# Patient Record
Sex: Female | Born: 1950 | Race: White | Hispanic: No | Smoking: Never smoker
Health system: Southern US, Community
[De-identification: ages and names within clinical notes are randomized; demographics above are authoritative.]

## PROBLEM LIST (undated history)

## (undated) DIAGNOSIS — M1990 Unspecified osteoarthritis, unspecified site: Secondary | ICD-10-CM

## (undated) DIAGNOSIS — R112 Nausea with vomiting, unspecified: Secondary | ICD-10-CM

## (undated) DIAGNOSIS — I10 Essential (primary) hypertension: Secondary | ICD-10-CM

## (undated) DIAGNOSIS — N189 Chronic kidney disease, unspecified: Secondary | ICD-10-CM

## (undated) DIAGNOSIS — F32A Depression, unspecified: Secondary | ICD-10-CM

## (undated) DIAGNOSIS — E7800 Pure hypercholesterolemia, unspecified: Secondary | ICD-10-CM

## (undated) DIAGNOSIS — F419 Anxiety disorder, unspecified: Secondary | ICD-10-CM

## (undated) DIAGNOSIS — Z9889 Other specified postprocedural states: Secondary | ICD-10-CM

## (undated) DIAGNOSIS — F329 Major depressive disorder, single episode, unspecified: Secondary | ICD-10-CM

## (undated) DIAGNOSIS — R011 Cardiac murmur, unspecified: Secondary | ICD-10-CM

## (undated) DIAGNOSIS — T7840XA Allergy, unspecified, initial encounter: Secondary | ICD-10-CM

## (undated) HISTORY — DX: Allergy, unspecified, initial encounter: T78.40XA

## (undated) HISTORY — PX: FOOT SURGERY: SHX648

## (undated) HISTORY — DX: Anxiety disorder, unspecified: F41.9

## (undated) HISTORY — DX: Pure hypercholesterolemia, unspecified: E78.00

## (undated) HISTORY — PX: ABDOMINAL HYSTERECTOMY: SHX81

## (undated) HISTORY — PX: TONSILLECTOMY: SUR1361

## (undated) HISTORY — DX: Essential (primary) hypertension: I10

## (undated) HISTORY — PX: SHOULDER SURGERY: SHX246

## (undated) HISTORY — PX: TENDON REPAIR: SHX5111

---

## 1998-02-28 ENCOUNTER — Other Ambulatory Visit: Admission: RE | Admit: 1998-02-28 | Discharge: 1998-02-28 | Payer: Self-pay | Admitting: Gynecology

## 1998-09-29 ENCOUNTER — Other Ambulatory Visit: Admission: RE | Admit: 1998-09-29 | Discharge: 1998-09-29 | Payer: Self-pay | Admitting: Gynecology

## 1999-05-02 ENCOUNTER — Other Ambulatory Visit: Admission: RE | Admit: 1999-05-02 | Discharge: 1999-05-02 | Payer: Self-pay | Admitting: Gynecology

## 1999-07-26 ENCOUNTER — Encounter (INDEPENDENT_AMBULATORY_CARE_PROVIDER_SITE_OTHER): Payer: Self-pay | Admitting: Specialist

## 1999-07-26 ENCOUNTER — Inpatient Hospital Stay (HOSPITAL_COMMUNITY): Admission: RE | Admit: 1999-07-26 | Discharge: 1999-07-27 | Payer: Self-pay | Admitting: Gynecology

## 2000-05-14 ENCOUNTER — Other Ambulatory Visit: Admission: RE | Admit: 2000-05-14 | Discharge: 2000-05-14 | Payer: Self-pay | Admitting: Gynecology

## 2000-12-16 ENCOUNTER — Ambulatory Visit (HOSPITAL_COMMUNITY): Admission: RE | Admit: 2000-12-16 | Discharge: 2000-12-16 | Payer: Self-pay | Admitting: Gastroenterology

## 2001-05-20 ENCOUNTER — Other Ambulatory Visit: Admission: RE | Admit: 2001-05-20 | Discharge: 2001-05-20 | Payer: Self-pay | Admitting: Gynecology

## 2002-08-19 ENCOUNTER — Other Ambulatory Visit: Admission: RE | Admit: 2002-08-19 | Discharge: 2002-08-19 | Payer: Self-pay | Admitting: Gynecology

## 2003-09-06 ENCOUNTER — Other Ambulatory Visit: Admission: RE | Admit: 2003-09-06 | Discharge: 2003-09-06 | Payer: Self-pay | Admitting: Gynecology

## 2004-11-27 ENCOUNTER — Other Ambulatory Visit: Admission: RE | Admit: 2004-11-27 | Discharge: 2004-11-27 | Payer: Self-pay | Admitting: Gynecology

## 2005-07-30 ENCOUNTER — Ambulatory Visit: Payer: Self-pay | Admitting: Family Medicine

## 2006-01-23 ENCOUNTER — Ambulatory Visit: Payer: Self-pay | Admitting: Specialist

## 2006-03-16 ENCOUNTER — Other Ambulatory Visit: Payer: Self-pay

## 2006-03-16 ENCOUNTER — Emergency Department: Payer: Self-pay | Admitting: Emergency Medicine

## 2006-03-21 ENCOUNTER — Ambulatory Visit: Payer: Self-pay

## 2006-04-10 ENCOUNTER — Ambulatory Visit: Payer: Self-pay | Admitting: Unknown Physician Specialty

## 2006-06-17 ENCOUNTER — Other Ambulatory Visit: Admission: RE | Admit: 2006-06-17 | Discharge: 2006-06-17 | Payer: Self-pay | Admitting: Gynecology

## 2007-08-20 ENCOUNTER — Other Ambulatory Visit: Admission: RE | Admit: 2007-08-20 | Discharge: 2007-08-20 | Payer: Self-pay | Admitting: Gynecology

## 2010-12-06 LAB — HM COLONOSCOPY

## 2011-04-30 HISTORY — PX: COLONOSCOPY: SHX174

## 2013-04-02 ENCOUNTER — Ambulatory Visit (INDEPENDENT_AMBULATORY_CARE_PROVIDER_SITE_OTHER): Payer: BC Managed Care – PPO

## 2013-04-02 ENCOUNTER — Encounter: Payer: Self-pay | Admitting: Podiatry

## 2013-04-02 ENCOUNTER — Ambulatory Visit (INDEPENDENT_AMBULATORY_CARE_PROVIDER_SITE_OTHER): Payer: BC Managed Care – PPO | Admitting: Podiatry

## 2013-04-02 VITALS — BP 140/73 | HR 77 | Resp 16 | Ht 63.0 in | Wt 138.0 lb

## 2013-04-02 DIAGNOSIS — M2010 Hallux valgus (acquired), unspecified foot: Secondary | ICD-10-CM

## 2013-04-02 DIAGNOSIS — M79671 Pain in right foot: Secondary | ICD-10-CM

## 2013-04-02 DIAGNOSIS — L600 Ingrowing nail: Secondary | ICD-10-CM

## 2013-04-02 DIAGNOSIS — M79609 Pain in unspecified limb: Secondary | ICD-10-CM

## 2013-04-02 NOTE — Patient Instructions (Signed)

## 2013-04-02 NOTE — Progress Notes (Signed)
   Subjective:    Patient ID: Teresa Duarte, female    DOB: Oct 07, 1950, 62 y.o.   MRN: [id number]  HPI Comments: N pain L b/l great toenails D 1 m O slowly C same A touching toes, shoes T pt trims toenails, has pedicures    N 0 L B/L FEET BUNIONS D 1 YR O SLOWLY C SAME A 0 T 0   Toe Pain       Review of Systems  Constitutional: Negative.   HENT: Negative.   Eyes: Negative.   Cardiovascular: Negative.   Gastrointestinal: Negative.   Endocrine: Negative.   Genitourinary: Negative.   Musculoskeletal: Negative.   Skin: Negative.   Allergic/Immunologic: Negative.   Neurological: Negative.   Hematological: Negative.   Psychiatric/Behavioral: Negative.        Objective:   Physical Exam        Assessment & Plan:

## 2013-04-03 NOTE — Progress Notes (Signed)
Subjective:     Patient ID: Teresa Duarte, female   DOB: October 07, 1950, 62 y.o.   MRN: [id number]  Toe Pain    patient states I haven't ingrown toenail left over right big toe and I have having problems with mild discomfort in my feet and I was concerned my bunions might be a reoccurring   Review of Systems  All other systems reviewed and are negative.       Objective:   Physical Exam  Nursing note and vitals reviewed. Constitutional: She is oriented to person, place, and time.  Cardiovascular: Intact distal pulses.   Musculoskeletal: Normal range of motion.  Neurological: She is oriented to person, place, and time.  Skin: Skin is warm.   neurovascular status intact with no health history changes noted and normal muscle strength and no equinus condition noted. Well-healing surgical sites first metatarsal both feet and incurvated medial border hallux left over right with pain when pressed     Assessment:     What appears to be well structured first MPJ is an incurvated and painful nailbeds left over right    Plan:     H&P and x-rays reviewed. Advised on correction of nail and explained risk with surgery and today patient wants to fix the left big toenail. Infiltrated 60 mg Xylocaine Marcaine mixture and removed the medial border exposing Metrix and applied phenol 3 applications followed by alcohol lavaged and sterile dressings. Instructed on soaks and advised recovery will be approximately 4 weeks and she needs to consider correction of the right

## 2013-04-05 ENCOUNTER — Telehealth: Payer: Self-pay | Admitting: *Deleted

## 2013-04-05 NOTE — Telephone Encounter (Signed)
Pt states she was not told how many days to soak her foot after her toenail procedure.  I instructed pt to soak the foot 20 minutes twice daily and apply either drops or antibiotic ointment, whichever her doctor ordered.ibuprofen told her at the end of the 4th week perform the last soak of the day, leave off the antibiotic drop or ointment if the area got a hard dry scab she could stop the soaks.

## 2013-08-12 LAB — HM DEXA SCAN

## 2013-08-26 ENCOUNTER — Ambulatory Visit: Payer: Self-pay | Admitting: Family Medicine

## 2013-08-30 ENCOUNTER — Encounter: Payer: Self-pay | Admitting: *Deleted

## 2013-09-03 ENCOUNTER — Ambulatory Visit: Payer: Self-pay | Admitting: Family Medicine

## 2013-09-16 ENCOUNTER — Ambulatory Visit: Payer: Self-pay | Admitting: Family Medicine

## 2013-09-22 ENCOUNTER — Encounter: Payer: Self-pay | Admitting: General Surgery

## 2013-09-22 ENCOUNTER — Ambulatory Visit (INDEPENDENT_AMBULATORY_CARE_PROVIDER_SITE_OTHER): Payer: BC Managed Care – PPO | Admitting: General Surgery

## 2013-09-22 VITALS — Ht 63.0 in | Wt 141.0 lb

## 2013-09-22 DIAGNOSIS — R221 Localized swelling, mass and lump, neck: Secondary | ICD-10-CM

## 2013-09-22 DIAGNOSIS — R220 Localized swelling, mass and lump, head: Secondary | ICD-10-CM

## 2013-09-22 NOTE — Patient Instructions (Signed)
Patient to return as needed. 

## 2013-09-22 NOTE — Progress Notes (Signed)
Patient ID: Teresa Duarte, female   DOB: 08-19-1950, 63 y.o.   MRN: [id number]  Chief Complaint  Patient presents with  . Other    lipoma    HPI Teresa Duarte is a 63 y.o. female. here today for an evalation of a lipoma on left neck. Patient states the lump has been there for about six months and is getting bigger. No pain noticed. She states there is another one coming up on the right side.                           HPI  Past Medical History  Diagnosis Date  . HBP (high blood pressure)   . High cholesterol   . Anxiety     Past Surgical History  Procedure Laterality Date  . Abdominal hysterectomy    . Tonsillectomy    . Shoulder surgery Right   . Foot surgery Bilateral   . Colonoscopy  2013    No family history on file.  Social History History  Substance Use Topics  . Smoking status: Never Smoker   . Smokeless tobacco: Never Used  . Alcohol Use: No     Comment: not no more     No Known Allergies  Current Outpatient Prescriptions  Medication Sig Dispense Refill  . aspirin 81 MG tablet Take 81 mg by mouth daily.      . chlordiazePOXIDE (LIBRIUM) 25 MG capsule Take 25 mg by mouth every 6 (six) hours.      . Cholecalciferol (D3-1000 PO) Take by mouth daily.      . Coenzyme Q10 (COQ-10) 75 MG CAPS Take by mouth daily.      Marland Kitchen desvenlafaxine (PRISTIQ) 50 MG 24 hr tablet Take 50 mg by mouth daily.      Marland Kitchen diltiazem (DILACOR XR) 180 MG 24 hr capsule Take 180 mg by mouth daily.      Marland Kitchen loratadine (CLARITIN) 10 MG tablet Take 10 mg by mouth daily.      . Multiple Vitamins-Minerals (MULTIVITAMIN WITH MINERALS) tablet Take 1 tablet by mouth daily.      Marland Kitchen omeprazole (PRILOSEC) 40 MG capsule Take 40 mg by mouth daily.      . valACYclovir (VALTREX) 500 MG tablet Take 500 mg by mouth daily.      . vitamin C (ASCORBIC ACID) 500 MG tablet Take 500 mg by mouth daily.       No current facility-administered medications for this visit.    Review of Systems Review of  Systems  Constitutional: Negative.   Respiratory: Negative.   Cardiovascular: Negative.     Height 5\' 3"  (1.6 m), weight 141 lb (63.957 kg).  Physical Exam Physical Exam  Constitutional: She is oriented to person, place, and time. She appears well-developed and well-nourished.  Eyes: Conjunctivae are normal. No scleral icterus.  Neck: Neck supple.    No discrete lymphadenopathy is appreciated in the anterior or posterior cervical chain. Supraclavicular areas normal. Mild increase of adipose tissue is appreciated primarily lateral to the clavicular head of the SCM bilaterally. No discrete mass. No focal tenderness.  Cardiovascular: Normal rate, regular rhythm and normal heart sounds.   Pulmonary/Chest: Effort normal and breath sounds normal.  Abdominal: Soft. Normal appearance and bowel sounds are normal. There is no splenomegaly or hepatomegaly. There is no tenderness.  Liver edge palpable.  Lymphadenopathy:    She has no cervical adenopathy.    She has no axillary adenopathy.  Right: No inguinal adenopathy present.       Left: No inguinal adenopathy present.  Neurological: She is alert and oriented to person, place, and time.  Skin: Skin is warm and dry.    Data Reviewed Laboratory studies dated August 26, 2013 showed a CBC with a hemoglobin of 13.3, white blood cell count 4200 with 57% polys, 31% lymphocytes, MCV 93, platelet count 247,000, amylase 755, mild increase in serum sodium of 145, normal liver function panel. Lipase normal at 36. Chest x-ray dated August 26, 2013 was unremarkable.  Assessment    Benign soft tissue examination of the neck.   Past history of elevated serum amylase, negative ultrasound by report. Patient previously had been drinking up to 750 mL of wine nightly.    Plan    Observation alone is warranted at this time.     PCP: Mitchel Honour Marlana Mckowen 09/23/2013, 10:02 PM

## 2013-09-23 DIAGNOSIS — R221 Localized swelling, mass and lump, neck: Secondary | ICD-10-CM | POA: Insufficient documentation

## 2013-12-01 LAB — HM MAMMOGRAPHY: HM Mammogram: NORMAL

## 2014-01-13 ENCOUNTER — Other Ambulatory Visit (HOSPITAL_COMMUNITY): Payer: Self-pay | Admitting: Gastroenterology

## 2014-01-13 DIAGNOSIS — R1011 Right upper quadrant pain: Secondary | ICD-10-CM

## 2014-01-13 DIAGNOSIS — R634 Abnormal weight loss: Secondary | ICD-10-CM

## 2014-01-19 ENCOUNTER — Encounter (HOSPITAL_COMMUNITY)
Admission: RE | Admit: 2014-01-19 | Discharge: 2014-01-19 | Disposition: A | Discharge status: Home / Self Care | Payer: BC Managed Care – PPO | Source: Ambulatory Visit | Attending: Diagnostic Radiology | Admitting: Diagnostic Radiology

## 2014-01-19 DIAGNOSIS — R1011 Right upper quadrant pain: Secondary | ICD-10-CM

## 2014-01-19 DIAGNOSIS — K828 Other specified diseases of gallbladder: Secondary | ICD-10-CM | POA: Diagnosis not present

## 2014-01-19 DIAGNOSIS — R634 Abnormal weight loss: Secondary | ICD-10-CM

## 2014-01-19 MED ORDER — TECHNETIUM TC 99M MEBROFENIN IV KIT
5.4000 | PACK | Freq: Once | INTRAVENOUS | Status: AC | PRN
  Administered 2014-01-19: 5.4 via INTRAVENOUS

## 2014-01-19 MED ORDER — SINCALIDE 5 MCG IJ SOLR
0.0200 ug/kg | Freq: Once | INTRAMUSCULAR | Status: AC
  Administered 2014-01-19: 1.1 ug via INTRAVENOUS

## 2014-02-10 ENCOUNTER — Other Ambulatory Visit (INDEPENDENT_AMBULATORY_CARE_PROVIDER_SITE_OTHER): Payer: Self-pay | Admitting: Surgery

## 2014-02-17 ENCOUNTER — Encounter (HOSPITAL_COMMUNITY): Payer: Self-pay | Admitting: Pharmacy Technician

## 2014-02-22 ENCOUNTER — Encounter (HOSPITAL_COMMUNITY)
Admission: RE | Admit: 2014-02-22 | Discharge: 2014-02-22 | Disposition: A | Discharge status: Home / Self Care | Payer: BC Managed Care – PPO | Source: Ambulatory Visit | Attending: Surgery | Admitting: Surgery

## 2014-02-22 ENCOUNTER — Encounter (HOSPITAL_COMMUNITY): Payer: Self-pay

## 2014-02-22 DIAGNOSIS — Z01818 Encounter for other preprocedural examination: Secondary | ICD-10-CM | POA: Diagnosis present

## 2014-02-22 DIAGNOSIS — I10 Essential (primary) hypertension: Secondary | ICD-10-CM | POA: Diagnosis not present

## 2014-02-22 HISTORY — DX: Depression, unspecified: F32.A

## 2014-02-22 HISTORY — DX: Cardiac murmur, unspecified: R01.1

## 2014-02-22 HISTORY — DX: Unspecified osteoarthritis, unspecified site: M19.90

## 2014-02-22 HISTORY — DX: Nausea with vomiting, unspecified: R11.2

## 2014-02-22 HISTORY — DX: Other specified postprocedural states: Z98.890

## 2014-02-22 HISTORY — DX: Chronic kidney disease, unspecified: N18.9

## 2014-02-22 HISTORY — DX: Major depressive disorder, single episode, unspecified: F32.9

## 2014-02-22 LAB — CBC
HEMATOCRIT: 37.5 % (ref 36.0–46.0)
HEMOGLOBIN: 12.7 g/dL (ref 12.0–15.0)
MCH: 31 pg (ref 26.0–34.0)
MCHC: 33.9 g/dL (ref 30.0–36.0)
MCV: 91.5 fL (ref 78.0–100.0)
Platelets: 261 10*3/uL (ref 150–400)
RBC: 4.1 MIL/uL (ref 3.87–5.11)
RDW: 12.2 % (ref 11.5–15.5)
WBC: 4.4 10*3/uL (ref 4.0–10.5)

## 2014-02-22 LAB — BASIC METABOLIC PANEL
Anion gap: 14 (ref 5–15)
BUN: 10 mg/dL (ref 6–23)
CHLORIDE: 100 meq/L (ref 96–112)
CO2: 24 meq/L (ref 19–32)
Calcium: 9.9 mg/dL (ref 8.4–10.5)
Creatinine, Ser: 0.71 mg/dL (ref 0.50–1.10)
GFR calc Af Amer: >90 mL/min (ref >90)
GFR, EST NON AFRICAN AMERICAN: 90 mL/min — AB (ref >90)
GLUCOSE: 121 mg/dL — AB (ref 70–99)
POTASSIUM: 3.8 meq/L (ref 3.7–5.3)
Sodium: 138 mEq/L (ref 137–147)

## 2014-02-22 NOTE — Pre-Procedure Instructions (Signed)
Venus Gilles  02/22/2014   Your procedure is scheduled on:  Thursday, Nov. 5th   Report to [facility] Admitting at  7:00  AM.  Call this number if you have problems the morning of surgery: [phone]   Remember:   Do not eat food or drink liquids after midnight Wednesday.   Take these medicines the morning of surgery with A SIP OF WATER: None   Do not wear jewelry, make-up or nail polish.  Do not wear lotions, powders, or perfumes. You may NOT wear deodorant the morning of surgery.  Do not shave underarms & legs 48 hours prior to surgery.    Do not bring valuables to the hospital.  [facility] is not responsible for any belongings or valuables.               Contacts, dentures or bridgework may not be worn into surgery.  Leave suitcase in the car. After surgery it may be brought to your room.  For patients admitted to the hospital, discharge time is determined by your treatment team.               Patients discharged the day of surgery will not be allowed to drive home, and will need a responsible adult to stay with you for the first 24 hrs after surgery.   Name and phone number of your driver:    Special Instructions: "Preparing for Surgery" instruction sheet.   Please read over the following fact sheets that you were given: Pain Booklet, Coughing and Deep Breathing and Surgical Site Infection Prevention

## 2014-02-28 ENCOUNTER — Encounter (HOSPITAL_COMMUNITY): Payer: Self-pay

## 2014-03-02 MED ORDER — CEFAZOLIN SODIUM-DEXTROSE 2-3 GM-% IV SOLR
2.0000 g | INTRAVENOUS | Status: AC
  Administered 2014-03-03: 2 g via INTRAVENOUS
  Filled 2014-03-02: qty 50

## 2014-03-02 NOTE — Progress Notes (Signed)
Pt returned call stating that she had received the message of new time of arrival. She voiced understanding that she needs to be here at 6:30 AM tomorrow.

## 2014-03-02 NOTE — Progress Notes (Signed)
Left a message for pt of time change with new arrival time of 0630;asked that she return call to confirm that she did received this message

## 2014-03-02 NOTE — H&P (Signed)
Teresa Duarte 02/10/2014 1:57 PM Location: [facility] Patient #: [id number] DOB: November 22, 1950 Married / Language: Teresa Duarte / Race: White Female  History of Present Illness (Campbell Kray A. Ninfa Linden MD; 02/10/2014 2:30 PM) Patient words: Eval gallbladder.  The patient is a 63 year old female who presents with non-malignant abdominal pain. This is a pleasant female referred by Dr. Earlie Raveling for evaluation of right upper quadrant abdominal pain. She has been having daily, intermittent, dull right upper quadrant abdominal pain since February of this year. She has had extensive workup of this including scans and endoscopies. Most recently, she had a hida scan demonstrating a decreased ejection fraction. The pain is slightly worse fatty meals. She has had a 20 pound weight loss since February. Bowel movements are normal. The pain does not refer any where else. She denies nausea, vomiting, or jaundice   Other Problems Illene Regulus, MA; 02/10/2014 1:57 PM) Pancreatitis  Past Surgical History Lars Mage Spillers, MA; 02/10/2014 1:57 PM) Foot Surgery Bilateral. Hysterectomy (not due to cancer) - Complete  Allergies (Alisha Spillers, MA; 02/10/2014 1:59 PM) No Known Drug Allergies10/15/2015  Medication History (Alisha Spillers, MA; 02/10/2014 2:00 PM) Pristiq (50MG  Tablet ER 24HR, Oral) Active. Dilacor XR (180MG  Capsule ER 24HR, Oral two times daily) Active. Valtrex (500MG  Tablet, Oral) Active. Medications Reconciled  Social History Lars Mage [place], [place]; 02/10/2014 1:57 PM) Tobacco use Never smoker.  Pregnancy / Birth History Lars Mage [place], [place]; 02/10/2014 1:57 PM) Age of menopause <63 Contraceptive History Contraceptive implant, Oral contraceptives.  Review of Systems Lars Mage Spillers MA; 02/10/2014 1:57 PM) General Present- Weight Loss. Not Present- Appetite Loss, Chills, Fatigue, Fever, Night Sweats and Weight Gain. HEENT Present- Hearing Loss, Ringing in the Ears,  Seasonal Allergies and Wears glasses/contact lenses. Not Present- Earache, Hoarseness, Nose Bleed, Oral Ulcers, Sinus Pain, Sore Throat, Visual Disturbances and Yellow Eyes. Cardiovascular Present- Leg Cramps. Not Present- Chest Pain, Difficulty Breathing Lying Down, Palpitations, Rapid Heart Rate, Shortness of Breath and Swelling of Extremities. Gastrointestinal Present- Abdominal Pain. Not Present- Bloating, Bloody Stool, Change in Bowel Habits, Chronic diarrhea, Constipation, Difficulty Swallowing, Excessive gas, Gets full quickly at meals, Hemorrhoids, Indigestion, Nausea, Rectal Pain and Vomiting. Female Genitourinary Present- Nocturia. Not Present- Frequency, Painful Urination, Pelvic Pain and Urgency. Musculoskeletal Not Present- Back Pain, Joint Pain, Joint Stiffness, Muscle Pain, Muscle Weakness and Swelling of Extremities. Neurological Not Present- Decreased Memory, Fainting, Headaches, Numbness, Seizures, Tingling, Tremor, Trouble walking and Weakness. Psychiatric Present- Anxiety and Depression. Not Present- Bipolar, Change in Sleep Pattern, Fearful and Frequent crying.   Vitals (Alisha Spillers MA; 02/10/2014 1:58 PM) 02/10/2014 1:58 PM Weight: 125 lb Height: 63in Body Surface Area: 1.59 m Body Mass Index: 22.14 kg/m Pulse: 68 (Regular)  BP: 124/82 (Sitting, Left Arm, Standard)    Physical Exam (Fabion Gatson A. Ninfa Linden MD; 02/10/2014 2:33 PM) The physical exam findings are as follows: Note:Well-developed, well-nourished, in no acute distress  Head and Neck Note: External ears and nose are normal, hearing is normal, oropharynx is clear, neck is supple, trachea is midline, there is no cervical adenopathy   Eye Note: Pupils reacted bilaterally Anicteric   Chest and Lung Exam Note: Clear to auscultation bilaterally with normal respiratory effort   Cardiovascular Note: Regular rate and rhythm with no murmurs and no peripheral edema   Abdomen Note: Soft,  nondistended, no palpable masses, very mild tenderness with no guarding in the right upper quadrant   Neurologic Note: Awake, alert, oriented x3   Neuropsychiatric Note: judgment and affect are normal   Musculoskeletal  Note: no lung abnormalities     Assessment & Plan (Aadya Kindler A. 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 I discussed laparoscopic cholecystectomy with cholangiogram and gave her literature regarding this. I discussed the risks of surgery. These risks include but are not limited to bleeding, infection, bile duct injury, bile leak, injury to other structures, need to convert to an open procedure, a chance this may not resolve her, et Ronney Asters. I also discussed postoperative recovery. She wishes to proceed with surgery which will be scheduled

## 2014-03-03 ENCOUNTER — Ambulatory Visit (HOSPITAL_COMMUNITY): Payer: BC Managed Care – PPO | Admitting: Certified Registered Nurse Anesthetist

## 2014-03-03 ENCOUNTER — Encounter (HOSPITAL_COMMUNITY)
Admission: RE | Disposition: A | Discharge status: Home / Self Care | Payer: Self-pay | Source: Ambulatory Visit | Attending: Surgery

## 2014-03-03 ENCOUNTER — Ambulatory Visit (HOSPITAL_COMMUNITY): Payer: BC Managed Care – PPO

## 2014-03-03 ENCOUNTER — Ambulatory Visit (HOSPITAL_COMMUNITY)
Admission: RE | Admit: 2014-03-03 | Discharge: 2014-03-03 | Disposition: A | Discharge status: Home / Self Care | Payer: BC Managed Care – PPO | Source: Ambulatory Visit | Attending: Surgery | Admitting: Surgery

## 2014-03-03 ENCOUNTER — Encounter (HOSPITAL_COMMUNITY): Payer: Self-pay | Admitting: *Deleted

## 2014-03-03 DIAGNOSIS — K811 Chronic cholecystitis: Secondary | ICD-10-CM | POA: Insufficient documentation

## 2014-03-03 DIAGNOSIS — I10 Essential (primary) hypertension: Secondary | ICD-10-CM | POA: Insufficient documentation

## 2014-03-03 DIAGNOSIS — K8020 Calculus of gallbladder without cholecystitis without obstruction: Secondary | ICD-10-CM

## 2014-03-03 DIAGNOSIS — M1990 Unspecified osteoarthritis, unspecified site: Secondary | ICD-10-CM | POA: Insufficient documentation

## 2014-03-03 DIAGNOSIS — F418 Other specified anxiety disorders: Secondary | ICD-10-CM | POA: Insufficient documentation

## 2014-03-03 DIAGNOSIS — K828 Other specified diseases of gallbladder: Secondary | ICD-10-CM | POA: Diagnosis present

## 2014-03-03 DIAGNOSIS — Z90710 Acquired absence of both cervix and uterus: Secondary | ICD-10-CM | POA: Diagnosis not present

## 2014-03-03 DIAGNOSIS — K859 Acute pancreatitis, unspecified: Secondary | ICD-10-CM | POA: Diagnosis not present

## 2014-03-03 HISTORY — PX: CHOLECYSTECTOMY: SHX55

## 2014-03-03 SURGERY — LAPAROSCOPIC CHOLECYSTECTOMY WITH INTRAOPERATIVE CHOLANGIOGRAM
Anesthesia: General | Site: Abdomen

## 2014-03-03 MED ORDER — PROPOFOL 10 MG/ML IV BOLUS
INTRAVENOUS | Status: AC
  Filled 2014-03-03: qty 20

## 2014-03-03 MED ORDER — PROMETHAZINE HCL 25 MG/ML IJ SOLN
6.2500 mg | INTRAMUSCULAR | Status: DC | PRN
  Administered 2014-03-03: 6.25 mg via INTRAVENOUS

## 2014-03-03 MED ORDER — ACETAMINOPHEN 650 MG RE SUPP
650.0000 mg | RECTAL | Status: DC | PRN
Start: 2014-03-03 — End: 2014-03-03

## 2014-03-03 MED ORDER — OXYCODONE HCL 5 MG/5ML PO SOLN: 5.0000 mg | Freq: Once | ORAL | Status: AC | PRN

## 2014-03-03 MED ORDER — LACTATED RINGERS IV SOLN
INTRAVENOUS | Status: DC
  Administered 2014-03-03: 07:00:00 via INTRAVENOUS

## 2014-03-03 MED ORDER — OXYCODONE-ACETAMINOPHEN 5-325 MG PO TABS: 1.0000 | ORAL_TABLET | ORAL | Status: DC | PRN

## 2014-03-03 MED ORDER — PROMETHAZINE HCL 12.5 MG PO TABS: 12.5000 mg | ORAL_TABLET | Freq: Four times a day (QID) | ORAL | Status: DC | PRN

## 2014-03-03 MED ORDER — EPHEDRINE SULFATE 50 MG/ML IJ SOLN
INTRAMUSCULAR | Status: DC | PRN
  Administered 2014-03-03: 15 mg via INTRAVENOUS
  Administered 2014-03-03: 10 mg via INTRAVENOUS

## 2014-03-03 MED ORDER — OXYCODONE HCL 5 MG PO TABS: 5.0000 mg | ORAL_TABLET | ORAL | Status: DC | PRN

## 2014-03-03 MED ORDER — FENTANYL CITRATE 0.05 MG/ML IJ SOLN
25.0000 ug | INTRAMUSCULAR | Status: DC | PRN
  Administered 2014-03-03 (×2): 50 ug via INTRAVENOUS

## 2014-03-03 MED ORDER — SODIUM CHLORIDE 0.9 % IV SOLN
INTRAVENOUS | Status: DC | PRN
  Administered 2014-03-03: 50 mL

## 2014-03-03 MED ORDER — DEXAMETHASONE SODIUM PHOSPHATE 4 MG/ML IJ SOLN
INTRAMUSCULAR | Status: AC
  Filled 2014-03-03: qty 1

## 2014-03-03 MED ORDER — SODIUM CHLORIDE 0.9 % IJ SOLN
INTRAMUSCULAR | Status: AC
  Filled 2014-03-03: qty 10

## 2014-03-03 MED ORDER — FENTANYL CITRATE 0.05 MG/ML IJ SOLN
INTRAMUSCULAR | Status: AC
  Filled 2014-03-03: qty 2

## 2014-03-03 MED ORDER — DEXAMETHASONE SODIUM PHOSPHATE 4 MG/ML IJ SOLN
INTRAMUSCULAR | Status: DC | PRN
  Administered 2014-03-03: 4 mg via INTRAVENOUS

## 2014-03-03 MED ORDER — MIDAZOLAM HCL 2 MG/2ML IJ SOLN
INTRAMUSCULAR | Status: AC
  Filled 2014-03-03: qty 2

## 2014-03-03 MED ORDER — PROPOFOL 10 MG/ML IV BOLUS
INTRAVENOUS | Status: DC | PRN
  Administered 2014-03-03: 150 mg via INTRAVENOUS

## 2014-03-03 MED ORDER — SUCCINYLCHOLINE CHLORIDE 20 MG/ML IJ SOLN
INTRAMUSCULAR | Status: DC | PRN
  Administered 2014-03-03: 100 mg via INTRAVENOUS

## 2014-03-03 MED ORDER — ROCURONIUM BROMIDE 50 MG/5ML IV SOLN
INTRAVENOUS | Status: AC
  Filled 2014-03-03: qty 1

## 2014-03-03 MED ORDER — SODIUM CHLORIDE 0.9 % IJ SOLN
3.0000 mL | Freq: Two times a day (BID) | INTRAMUSCULAR | Status: DC
Start: 2014-03-03 — End: 2014-03-03

## 2014-03-03 MED ORDER — SCOPOLAMINE 1 MG/3DAYS TD PT72
MEDICATED_PATCH | TRANSDERMAL | Status: AC
  Filled 2014-03-03: qty 1

## 2014-03-03 MED ORDER — OXYCODONE HCL 5 MG PO TABS
5.0000 mg | ORAL_TABLET | Freq: Once | ORAL | Status: AC | PRN
  Administered 2014-03-03: 5 mg via ORAL

## 2014-03-03 MED ORDER — LACTATED RINGERS IV SOLN
INTRAVENOUS | Status: DC | PRN
  Administered 2014-03-03 (×2): via INTRAVENOUS

## 2014-03-03 MED ORDER — SODIUM CHLORIDE 0.9 % IR SOLN
Status: DC | PRN
  Administered 2014-03-03: 1000 mL

## 2014-03-03 MED ORDER — DIPHENHYDRAMINE HCL 50 MG/ML IJ SOLN
INTRAMUSCULAR | Status: AC
  Filled 2014-03-03: qty 1

## 2014-03-03 MED ORDER — FENTANYL CITRATE 0.05 MG/ML IJ SOLN
INTRAMUSCULAR | Status: DC | PRN
  Administered 2014-03-03: 150 ug via INTRAVENOUS

## 2014-03-03 MED ORDER — BUPIVACAINE-EPINEPHRINE (PF) 0.25% -1:200000 IJ SOLN
INTRAMUSCULAR | Status: AC
  Filled 2014-03-03: qty 30

## 2014-03-03 MED ORDER — LIDOCAINE HCL (CARDIAC) 20 MG/ML IV SOLN
INTRAVENOUS | Status: AC
  Filled 2014-03-03: qty 5

## 2014-03-03 MED ORDER — OXYCODONE HCL 5 MG PO TABS
ORAL_TABLET | ORAL | Status: AC
  Filled 2014-03-03: qty 1

## 2014-03-03 MED ORDER — ACETAMINOPHEN 325 MG PO TABS: 650.0000 mg | ORAL_TABLET | ORAL | Status: DC | PRN

## 2014-03-03 MED ORDER — BUPIVACAINE-EPINEPHRINE 0.25% -1:200000 IJ SOLN
INTRAMUSCULAR | Status: DC | PRN
  Administered 2014-03-03: 30 mL

## 2014-03-03 MED ORDER — FENTANYL CITRATE 0.05 MG/ML IJ SOLN
INTRAMUSCULAR | Status: AC
  Filled 2014-03-03: qty 5

## 2014-03-03 MED ORDER — ONDANSETRON HCL 4 MG/2ML IJ SOLN
INTRAMUSCULAR | Status: AC
  Filled 2014-03-03: qty 2

## 2014-03-03 MED ORDER — SODIUM CHLORIDE 0.9 % IV SOLN: 250.0000 mL | INTRAVENOUS | Status: DC | PRN

## 2014-03-03 MED ORDER — SUCCINYLCHOLINE CHLORIDE 20 MG/ML IJ SOLN
INTRAMUSCULAR | Status: AC
  Filled 2014-03-03: qty 1

## 2014-03-03 MED ORDER — KETOROLAC TROMETHAMINE 30 MG/ML IJ SOLN
INTRAMUSCULAR | Status: AC
  Filled 2014-03-03: qty 1

## 2014-03-03 MED ORDER — SODIUM CHLORIDE 0.9 % IJ SOLN: 3.0000 mL | INTRAMUSCULAR | Status: DC | PRN

## 2014-03-03 MED ORDER — SCOPOLAMINE 1 MG/3DAYS TD PT72
MEDICATED_PATCH | TRANSDERMAL | Status: DC | PRN
  Administered 2014-03-03: 1 via TRANSDERMAL

## 2014-03-03 MED ORDER — LIDOCAINE HCL (CARDIAC) 20 MG/ML IV SOLN
INTRAVENOUS | Status: DC | PRN
  Administered 2014-03-03: 100 mg via INTRAVENOUS

## 2014-03-03 MED ORDER — ONDANSETRON HCL 4 MG/2ML IJ SOLN
INTRAMUSCULAR | Status: DC | PRN
  Administered 2014-03-03: 4 mg via INTRAVENOUS

## 2014-03-03 MED ORDER — PROMETHAZINE HCL 25 MG/ML IJ SOLN
INTRAMUSCULAR | Status: AC
  Filled 2014-03-03: qty 1

## 2014-03-03 MED ORDER — 0.9 % SODIUM CHLORIDE (POUR BTL) OPTIME
TOPICAL | Status: DC | PRN
  Administered 2014-03-03: 1000 mL

## 2014-03-03 MED ORDER — MORPHINE SULFATE 2 MG/ML IJ SOLN: 1.0000 mg | INTRAMUSCULAR | Status: DC | PRN

## 2014-03-03 MED ORDER — EPHEDRINE SULFATE 50 MG/ML IJ SOLN
INTRAMUSCULAR | Status: AC
  Filled 2014-03-03: qty 1

## 2014-03-03 MED ORDER — DIPHENHYDRAMINE HCL 50 MG/ML IJ SOLN
INTRAMUSCULAR | Status: DC | PRN
  Administered 2014-03-03: 12.5 mg via INTRAVENOUS

## 2014-03-03 MED ORDER — MIDAZOLAM HCL 5 MG/5ML IJ SOLN
INTRAMUSCULAR | Status: DC | PRN
  Administered 2014-03-03: 2 mg via INTRAVENOUS

## 2014-03-03 SURGICAL SUPPLY — 45 items
APPLIER CLIP 5 13 M/L LIGAMAX5 (MISCELLANEOUS) ×3
BANDAGE ADH SHEER 1  50/CT (GAUZE/BANDAGES/DRESSINGS) ×12 IMPLANT
BENZOIN TINCTURE PRP APPL 2/3 (GAUZE/BANDAGES/DRESSINGS) ×3 IMPLANT
CANISTER SUCTION 2500CC (MISCELLANEOUS) ×3 IMPLANT
CHLORAPREP W/TINT 26ML (MISCELLANEOUS) ×3 IMPLANT
CLIP APPLIE 5 13 M/L LIGAMAX5 (MISCELLANEOUS) ×1 IMPLANT
CLOSURE WOUND 1/2 X4 (GAUZE/BANDAGES/DRESSINGS) ×1
CLOSURE WOUND 1/4X4 (GAUZE/BANDAGES/DRESSINGS)
COVER MAYO STAND STRL (DRAPES) ×3 IMPLANT
COVER SURGICAL LIGHT HANDLE (MISCELLANEOUS) ×3 IMPLANT
DRAPE C-ARM 42X72 X-RAY (DRAPES) ×3 IMPLANT
DRAPE LAPAROSCOPIC ABDOMINAL (DRAPES) ×3 IMPLANT
DRAPE UTILITY 15X26 W/TAPE STR (DRAPE) ×6 IMPLANT
ELECT REM PT RETURN 9FT ADLT (ELECTROSURGICAL) ×3
ELECTRODE REM PT RTRN 9FT ADLT (ELECTROSURGICAL) ×1 IMPLANT
GLOVE BIO SURGEON STRL SZ 6.5 (GLOVE) ×2 IMPLANT
GLOVE BIO SURGEON STRL SZ7.5 (GLOVE) ×3 IMPLANT
GLOVE BIO SURGEONS STRL SZ 6.5 (GLOVE) ×1
GLOVE BIOGEL PI IND STRL 7.0 (GLOVE) ×1 IMPLANT
GLOVE BIOGEL PI INDICATOR 7.0 (GLOVE) ×2
GLOVE SURG SIGNA 7.5 PF LTX (GLOVE) ×3 IMPLANT
GOWN STRL REUS W/ TWL LRG LVL3 (GOWN DISPOSABLE) ×2 IMPLANT
GOWN STRL REUS W/ TWL XL LVL3 (GOWN DISPOSABLE) ×1 IMPLANT
GOWN STRL REUS W/TWL LRG LVL3 (GOWN DISPOSABLE) ×4
GOWN STRL REUS W/TWL XL LVL3 (GOWN DISPOSABLE) ×2
KIT BASIN OR (CUSTOM PROCEDURE TRAY) ×3 IMPLANT
KIT ROOM TURNOVER OR (KITS) ×3 IMPLANT
NS IRRIG 1000ML POUR BTL (IV SOLUTION) ×3 IMPLANT
PAD ARMBOARD 7.5X6 YLW CONV (MISCELLANEOUS) ×3 IMPLANT
POUCH SPECIMEN RETRIEVAL 10MM (ENDOMECHANICALS) ×3 IMPLANT
SCISSORS LAP 5X35 DISP (ENDOMECHANICALS) ×3 IMPLANT
SET CHOLANGIOGRAPH 5 50 .035 (SET/KITS/TRAYS/PACK) ×3 IMPLANT
SET IRRIG TUBING LAPAROSCOPIC (IRRIGATION / IRRIGATOR) ×3 IMPLANT
SLEEVE ENDOPATH XCEL 5M (ENDOMECHANICALS) ×6 IMPLANT
SPECIMEN JAR SMALL (MISCELLANEOUS) ×3 IMPLANT
STRIP CLOSURE SKIN 1/2X4 (GAUZE/BANDAGES/DRESSINGS) ×2 IMPLANT
STRIP CLOSURE SKIN 1/4X4 (GAUZE/BANDAGES/DRESSINGS) IMPLANT
SUT MON AB 4-0 PC3 18 (SUTURE) ×3 IMPLANT
SUT VICRYL 0 UR6 27IN ABS (SUTURE) ×3 IMPLANT
TOWEL OR 17X24 6PK STRL BLUE (TOWEL DISPOSABLE) ×3 IMPLANT
TOWEL OR 17X26 10 PK STRL BLUE (TOWEL DISPOSABLE) ×3 IMPLANT
TRAY LAPAROSCOPIC (CUSTOM PROCEDURE TRAY) ×3 IMPLANT
TROCAR XCEL BLUNT TIP 100MML (ENDOMECHANICALS) ×3 IMPLANT
TROCAR XCEL NON-BLD 5MMX100MML (ENDOMECHANICALS) ×3 IMPLANT
TUBING INSUFFLATION (TUBING) ×3 IMPLANT

## 2014-03-03 NOTE — Interval H&P Note (Signed)
History and Physical Interval Note: no change in H and P  03/03/2014 8:00 AM  Teresa Duarte  has presented today for surgery, with the diagnosis of Biliary Dyskinesia  The various methods of treatment have been discussed with the patient and family. After consideration of risks, benefits and other options for treatment, the patient has consented to  Procedure(s): LAPAROSCOPIC CHOLECYSTECTOMY WITH INTRAOPERATIVE CHOLANGIOGRAM (N/A) as a surgical intervention .  The patient's history has been reviewed, patient examined, no change in status, stable for surgery.  I have reviewed the patient's chart and labs.  Questions were answered to the patient's satisfaction.     Raimi Guillermo A

## 2014-03-03 NOTE — Transfer of Care (Signed)
Immediate Anesthesia Transfer of Care Note  Patient: Teresa Duarte  Procedure(s) Performed: Procedure(s): LAPAROSCOPIC CHOLECYSTECTOMY WITH INTRAOPERATIVE CHOLANGIOGRAM (N/A)  Patient Location: PACU  Anesthesia Type:General  Level of Consciousness: awake, alert  and oriented  Airway & Oxygen Therapy: Patient Spontanous Breathing and Patient connected to nasal cannula oxygen  Post-op Assessment: Report given to PACU RN, Post -op Vital signs reviewed and stable and Patient moving all extremities  Post vital signs: Reviewed and stable  Complications: No apparent anesthesia complications

## 2014-03-03 NOTE — Discharge Instructions (Signed)
[facility] ______[facility] LAPAROSCOPIC SURGERY: POST OP INSTRUCTIONS Always review your discharge instruction sheet given to you by the facility where your surgery was performed. IF YOU HAVE DISABILITY OR FAMILY LEAVE FORMS, YOU MUST BRING THEM TO THE OFFICE FOR PROCESSING.   DO NOT GIVE THEM TO YOUR DOCTOR.  1. A prescription for pain medication may be given to you upon discharge.  Take your pain medication as prescribed, if needed.  If narcotic pain medicine is not needed, then you may take acetaminophen (Tylenol) or ibuprofen (Advil) as needed. 2. Take your usually prescribed medications unless otherwise directed. 3. If you need a refill on your pain medication, please contact your pharmacy.  They will contact our office to request authorization. Prescriptions will not be filled after 5pm or on week-ends. 4. You should follow a light diet the first few days after arrival home, such as soup and crackers, etc.  Be sure to include lots of fluids daily. 5. Most patients will experience some swelling and bruising in the area of the incisions.  Ice packs will help.  Swelling and bruising can take several days to resolve.  6. It is common to experience some constipation if taking pain medication after surgery.  Increasing fluid intake and taking a stool softener (such as Colace) will usually help or prevent this problem from occurring.  A mild laxative (Milk of Magnesia or Miralax) should be taken according to package instructions if there are no bowel movements after 48 hours. 7. Unless discharge instructions indicate otherwise, you may remove your bandages 24-48 hours after surgery, and you may shower at that time.  You may have steri-strips (small skin tapes) in place directly over the incision.  These strips should be left on the skin for 7-10 days.  If your surgeon used skin glue on the incision, you may shower in 24 hours.  The glue will flake off over the next 2-3 weeks.  Any sutures or  staples will be removed at the office during your follow-up visit. 8. ACTIVITIES:  You may resume regular (light) daily activities beginning the next day--such as daily self-care, walking, climbing stairs--gradually increasing activities as tolerated.  You may have sexual intercourse when it is comfortable.  Refrain from any heavy lifting or straining until approved by your doctor. a. You may drive when you are no longer taking prescription pain medication, you can comfortably wear a seatbelt, and you can safely maneuver your car and apply brakes. b. RETURN TO WORK:  __________________________________________________________ 9. You should see your doctor in the office for a follow-up appointment approximately 2-3 weeks after your surgery.  Make sure that you call for this appointment within a day or two after you arrive home to insure a convenient appointment time. 10. OTHER INSTRUCTIONS: __ICE PACK AND IBUPROFEN ALSO FOR PAIN. 11. NO LIFTING MORE THAN 15 POUNDS FOR 2 WEEKS. 12. ________________________________________________________________________________________________________________________ __________________________________________________________________________________________________________________________ WHEN TO CALL YOUR DOCTOR: 1. Fever over 101.0 2. Inability to urinate 3. Continued bleeding from incision. 4. Increased pain, redness, or drainage from the incision. 5. Increasing abdominal pain  The clinic staff is available to answer your questions during regular business hours.  Please dont hesitate to call and ask to speak to one of the nurses for clinical concerns.  If you have a medical emergency, go to the nearest emergency room or call 911.  A surgeon from [facility] is always on call at the hospital. [address], [place] , [place],   [address] ?  [id number], [place], [place]   [address] [phone] ? [phone] ? FAX [phone] Web site:  [email or web address]

## 2014-03-03 NOTE — Progress Notes (Signed)
Report given to philip rn as caregiver 

## 2014-03-03 NOTE — Anesthesia Preprocedure Evaluation (Signed)
Anesthesia Evaluation  Patient identified by MRN, date of birth, ID band Patient awake    Reviewed: Allergy & Precautions, H&P , NPO status , Patient's Chart, lab work & pertinent test results  History of Anesthesia Complications (+) PONV  Airway Mallampati: I  TM Distance: >3 FB Neck ROM: Full    Dental  (+) Teeth Intact, Dental Advisory Given   Pulmonary neg pulmonary ROS,  breath sounds clear to auscultation        Cardiovascular hypertension, Pt. on medications Rhythm:Regular Rate:Normal - Systolic murmurs    Neuro/Psych Anxiety Depression negative neurological ROS     GI/Hepatic negative GI ROS, Neg liver ROS,   Endo/Other  negative endocrine ROS  Renal/GU negative Renal ROS     Musculoskeletal  (+) Arthritis -,   Abdominal   Peds  Hematology negative hematology ROS (+)   Anesthesia Other Findings   Reproductive/Obstetrics                             Anesthesia Physical Anesthesia Plan  ASA: II  Anesthesia Plan: General   Post-op Pain Management:    Induction: Intravenous  Airway Management Planned: Oral ETT  Additional Equipment:   Intra-op Plan:   Post-operative Plan: Extubation in OR  Informed Consent: I have reviewed the patients History and Physical, chart, labs and discussed the procedure including the risks, benefits and alternatives for the proposed anesthesia with the patient or authorized representative who has indicated his/her understanding and acceptance.   Dental advisory given  Plan Discussed with: CRNA and Surgeon  Anesthesia Plan Comments:         Anesthesia Quick Evaluation

## 2014-03-03 NOTE — Op Note (Signed)
Laparoscopic Cholecystectomy with IOC Procedure Note  Indications: This patient presents with symptomatic gallbladder disease and will undergo laparoscopic cholecystectomy.  Pre-operative Diagnosis: biliary dyskinesia  Post-operative Diagnosis: Same  Surgeon: Coralie Keens A   Assistants: 0  Anesthesia: General endotracheal anesthesia  ASA Class: 2  Procedure Details  The patient was seen again in the Holding Room. The risks, benefits, complications, treatment options, and expected outcomes were discussed with the patient. The possibilities of reaction to medication, pulmonary aspiration, perforation of viscus, bleeding, recurrent infection, finding a normal gallbladder, the need for additional procedures, failure to diagnose a condition, the possible need to convert to an open procedure, and creating a complication requiring transfusion or operation were discussed with the patient. The likelihood of improving the patient's symptoms with return to their baseline status is good.  The patient and/or family concurred with the proposed plan, giving informed consent. The site of surgery properly noted. The patient was taken to Operating Room, identified as Gianelle Mccaul and the procedure verified as Laparoscopic Cholecystectomy with Intraoperative Cholangiogram. A Time Out was held and the above information confirmed.  Prior to the induction of general anesthesia, antibiotic prophylaxis was administered. General endotracheal anesthesia was then administered and tolerated well. After the induction, the abdomen was prepped with Chloraprep and draped in the sterile fashion. The patient was positioned in the supine position.  Local anesthetic agent was injected into the skin near the umbilicus and an incision made. We dissected down to the abdominal fascia with blunt dissection.  The fascia was incised vertically and we entered the peritoneal cavity bluntly.  A pursestring suture of 0-Vicryl was placed  around the fascial opening.  The Hasson cannula was inserted and secured with the stay suture.  Pneumoperitoneum was then created with CO2 and tolerated well without any adverse changes in the patient's vital signs. An 05-mm port was placed in the subxiphoid position.  Two 5-mm ports were placed in the right upper quadrant. All skin incisions were infiltrated with a local anesthetic agent before making the incision and placing the trocars.   We positioned the patient in reverse Trendelenburg, tilted slightly to the patient's left.  The gallbladder was identified, the fundus grasped and retracted cephalad. Adhesions were lysed bluntly and with the electrocautery where indicated, taking care not to injure any adjacent organs or viscus. The infundibulum was grasped and retracted laterally, exposing the peritoneum overlying the triangle of Calot. This was then divided and exposed in a blunt fashion. A critical view of the cystic duct and cystic artery was obtained.  The cystic duct was clearly identified and bluntly dissected circumferentially. The cystic duct was ligated with a clip distally.   An incision was made in the cystic duct and the [facility] cholangiogram catheter introduced. The catheter was secured using a clip. A cholangiogram was then obtained which showed good visualization of the distal and proximal biliary tree with no sign of filling defects or obstruction.  Contrast flowed easily into the duodenum. The catheter was then removed.   The cystic duct was then ligated with clips and divided. The cystic artery was identified, dissected free, ligated with clips and divided as well.   The gallbladder was dissected from the liver bed in retrograde fashion with the electrocautery. The gallbladder was removed and placed in an Endocatch sac. The liver bed was irrigated and inspected. Hemostasis was achieved with the electrocautery. Copious irrigation was utilized and was repeatedly aspirated until clear.  The  gallbladder and Endocatch sac were then removed  through the umbilical port site.  The pursestring suture was used to close the umbilical fascia.    We again inspected the right upper quadrant for hemostasis.  Pneumoperitoneum was released as we removed the trocars.  4-0 Monocryl was used to close the skin.   Benzoin, steri-strips, and clean dressings were applied. The patient was then extubated and brought to the recovery room in stable condition. Instrument, sponge, and needle counts were correct at closure and at the conclusion of the case.   Findings: Cholecystitis without Cholelithiasis Normal cholangiogram  Estimated Blood Loss: Minimal         Drains: 0         Specimens: Gallbladder           Complications: None; patient tolerated the procedure well.         Disposition: PACU - hemodynamically stable.         Condition: stable

## 2014-03-03 NOTE — Anesthesia Procedure Notes (Signed)
Procedure Name: Intubation Date/Time: 03/03/2014 8:06 AM Performed by: Trixie Deis A Pre-anesthesia Checklist: Patient identified, Emergency Drugs available, Suction available, Patient being monitored and Timeout performed Patient Re-evaluated:Patient Re-evaluated prior to inductionOxygen Delivery Method: Circle system utilized Preoxygenation: Pre-oxygenation with 100% oxygen Intubation Type: IV induction Ventilation: Mask ventilation without difficulty Laryngoscope Size: Mac and 3 Grade View: Grade II Tube type: Oral Tube size: 7.0 mm Number of attempts: 1 Airway Equipment and Method: Stylet Placement Confirmation: ETT inserted through vocal cords under direct vision and positive ETCO2 Secured at: 21 cm Tube secured with: Tape Dental Injury: Teeth and Oropharynx as per pre-operative assessment

## 2014-03-04 ENCOUNTER — Encounter (HOSPITAL_COMMUNITY): Payer: Self-pay | Admitting: Surgery

## 2014-03-08 NOTE — Anesthesia Postprocedure Evaluation (Signed)
  Anesthesia Post-op Note  Patient: Teresa Duarte  Procedure(s) Performed: Procedure(s): LAPAROSCOPIC CHOLECYSTECTOMY WITH INTRAOPERATIVE CHOLANGIOGRAM (N/A)  Patient Location: PACU  Anesthesia Type:General  Level of Consciousness: awake, alert  and oriented  Airway and Oxygen Therapy: Patient Spontanous Breathing  Post-op Pain: none  Post-op Assessment: Post-op Vital signs reviewed  Post-op Vital Signs: Reviewed  Last Vitals:  Filed Vitals:   03/03/14 1123  BP:   Pulse: 81  Temp:   Resp: 19    Complications: No apparent anesthesia complications

## 2014-07-06 ENCOUNTER — Other Ambulatory Visit: Payer: Self-pay | Admitting: Gynecology

## 2014-07-07 LAB — CYTOLOGY - PAP

## 2014-07-14 ENCOUNTER — Other Ambulatory Visit: Payer: Self-pay | Admitting: Gynecology

## 2014-07-29 ENCOUNTER — Ambulatory Visit: Payer: BLUE CROSS/BLUE SHIELD | Attending: Gynecology | Admitting: Gynecology

## 2014-07-29 ENCOUNTER — Encounter: Payer: Self-pay | Admitting: Gynecology

## 2014-07-29 VITALS — BP 124/59 | HR 72 | Temp 97.7°F | Resp 20 | Ht 63.0 in | Wt 133.1 lb

## 2014-07-29 DIAGNOSIS — N891 Moderate vaginal dysplasia: Secondary | ICD-10-CM | POA: Diagnosis not present

## 2014-07-29 DIAGNOSIS — N901 Moderate vulvar dysplasia: Secondary | ICD-10-CM

## 2014-07-29 MED ORDER — ESTROGENS, CONJUGATED 0.625 MG/GM VA CREA: TOPICAL_CREAM | VAGINAL | Status: DC

## 2014-07-29 MED ORDER — FLUOROURACIL 5 % EX CREA: TOPICAL_CREAM | CUTANEOUS | Status: DC

## 2014-07-29 NOTE — Progress Notes (Signed)
Consult Note: Gyn-Onc   Teresa Duarte 64 y.o. female  Chief Complaint  Patient presents with  . 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  Plan: Treatment options were reviewed with the patient and we have elected to undertake a course of Efudex applying it vaginally at bedtime for 4 nights this month and 4 nights the following month with her return for repeat evaluation in 3 months. She is given prescriptions for Efudex and Premarin vaginal cream and an instruction sheet.   HPI: 63 year old white female seen in consultation request of Dr. Delila Pereyra regarding management of biopsy-proven VAIN 2. Patient previously had ASCUS Pap smears and has been evaluated previously with colposcopy. 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  Patient has a past history of a hysterectomy for menorrhagia associated with fibroids. She claims that she had not had abnormal Pap smears prior to the hysterectomy. She is aware that HPV is the primary cause of these abnormal Pap smears although she understands that the time course of transformation could take many years.  Review of Systems:10 point review of systems is negative except as noted in interval history.   Vitals: Blood pressure 124/59, pulse 72, temperature 97.7 F (36.5 C), temperature source Oral, resp. rate 20, height 5\' 3"  (1.6 m), weight 133 lb 1.6 oz (60.374 kg).  Physical Exam: General : The patient is a healthy woman in no acute distress.  HEENT: normocephalic, extraoccular movements normal; neck is supple without thyromegally  Lynphnodes: Supraclavicular and inguinal nodes not enlarged  Abdomen: Soft, non-tender, no ascites, no organomegally, no masses, no hernias  Pelvic:  EGBUS: Normal female  Vagina: Normal, no lesions  Urethra and Bladder:  Normal, non-tender  Cervix: Surgically absent  Uterus: Surgically absent  Bi-manual examination: Non-tender; no adenxal masses or nodularity  Rectal: normal sphincter tone, no masses, no blood  Lower extremities: No edema or varicosities. Normal range of motion   Procedure note: Colposcopy of the entire vagina was performed using green filter, acetic acid, and Lugol's solution. The biopsy site in the right upper vagina was identified. There appeared to be several small areas of white epithelium throughout the vagina.     Allergies  Allergen Reactions  . Other Other (See Comments)    Anesthesia caused nausea and vomiting about 20 years ago    Past Medical History  Diagnosis Date  . HBP (high blood pressure)   . High cholesterol   . Anxiety   . PONV (postoperative nausea and vomiting)   . Heart murmur     doesn't give her any problems  . Depression   . Chronic kidney disease     ?spot on her left kidney  . Arthritis     Past Surgical History  Procedure Laterality Date  . Abdominal hysterectomy    . Tonsillectomy    . Shoulder surgery Right   . Foot surgery Bilateral   . Colonoscopy  2013  . Tendon repair Left     tendon/ligament repair, pt not sure which  . Cholecystectomy N/A 03/03/2014    Procedure: LAPAROSCOPIC CHOLECYSTECTOMY WITH INTRAOPERATIVE CHOLANGIOGRAM;  Surgeon: Coralie Keens, MD;  Location: [place];  Service: General;  Laterality: N/A;    Current Outpatient Prescriptions  Medication Sig Dispense Refill  . aspirin 81 MG tablet Take 81 mg by mouth 3 (three)  times a week.    . chlordiazePOXIDE (LIBRIUM) 25 MG capsule Take 25 mg by mouth at bedtime as needed (sleep).     . Cholecalciferol (VITAMIN D-3) 1000 UNITS CAPS Take 1 capsule by mouth daily.    . Coenzyme Q10 (COQ10) 100 MG CAPS Take 1 capsule by mouth daily.    Marland Kitchen desvenlafaxine (PRISTIQ) 50 MG 24 hr tablet Take 50 mg by mouth daily.    Marland Kitchen DEXILANT 60 MG capsule Take 60 mg by mouth daily.   3  .  diltiazem (DILACOR XR) 180 MG 24 hr capsule Take 360 mg by mouth daily.     . valACYclovir (VALTREX) 500 MG tablet Take 500 mg by mouth daily.    . vitamin B-12 (CYANOCOBALAMIN) 1000 MCG tablet Take 1,000 mcg by mouth daily.     No current facility-administered medications for this visit.    History   Social History  . Marital Status: Married    Spouse Name: N/A  . Number of Children: N/A  . Years of Education: N/A   Occupational History  . Not on file.   Social History Main Topics  . Smoking status: Never Smoker   . Smokeless tobacco: Never Used  . Alcohol Use: 1.8 oz/week    3 Cans of beer per week     Comment: not no more   . Drug Use: No  . Sexual Activity: Yes   Other Topics Concern  . Not on file   Social History Narrative    History reviewed. No pertinent family history.    Alvino Chapel, MD 07/29/2014, 1:55 PM

## 2014-07-29 NOTE — Patient Instructions (Addendum)
Followup with Dr. Fermin Schwab in 3 months (July 2016). Please call us in few weeks to schedule this appointment.  Use Efudex cream and Premarin cream vaginally as prescribed. Please call our office with any questions or concerns.

## 2014-10-12 DIAGNOSIS — F320 Major depressive disorder, single episode, mild: Secondary | ICD-10-CM | POA: Insufficient documentation

## 2014-10-12 DIAGNOSIS — F419 Anxiety disorder, unspecified: Secondary | ICD-10-CM | POA: Insufficient documentation

## 2014-10-12 DIAGNOSIS — D1771 Benign lipomatous neoplasm of kidney: Secondary | ICD-10-CM | POA: Insufficient documentation

## 2014-10-12 DIAGNOSIS — F1011 Alcohol abuse, in remission: Secondary | ICD-10-CM | POA: Insufficient documentation

## 2014-10-12 DIAGNOSIS — F329 Major depressive disorder, single episode, unspecified: Secondary | ICD-10-CM | POA: Insufficient documentation

## 2014-10-12 DIAGNOSIS — K859 Acute pancreatitis without necrosis or infection, unspecified: Secondary | ICD-10-CM | POA: Insufficient documentation

## 2014-10-12 DIAGNOSIS — R1011 Right upper quadrant pain: Secondary | ICD-10-CM | POA: Insufficient documentation

## 2014-10-12 DIAGNOSIS — R221 Localized swelling, mass and lump, neck: Secondary | ICD-10-CM | POA: Insufficient documentation

## 2014-10-12 DIAGNOSIS — N2889 Other specified disorders of kidney and ureter: Secondary | ICD-10-CM | POA: Insufficient documentation

## 2014-10-12 DIAGNOSIS — F1020 Alcohol dependence, uncomplicated: Secondary | ICD-10-CM | POA: Insufficient documentation

## 2014-10-12 DIAGNOSIS — G4700 Insomnia, unspecified: Secondary | ICD-10-CM | POA: Insufficient documentation

## 2014-10-12 DIAGNOSIS — D170 Benign lipomatous neoplasm of skin and subcutaneous tissue of head, face and neck: Secondary | ICD-10-CM | POA: Insufficient documentation

## 2014-10-12 DIAGNOSIS — R002 Palpitations: Secondary | ICD-10-CM | POA: Insufficient documentation

## 2014-10-12 DIAGNOSIS — I10 Essential (primary) hypertension: Secondary | ICD-10-CM | POA: Insufficient documentation

## 2014-10-12 DIAGNOSIS — H905 Unspecified sensorineural hearing loss: Secondary | ICD-10-CM | POA: Insufficient documentation

## 2014-10-12 DIAGNOSIS — R634 Abnormal weight loss: Secondary | ICD-10-CM | POA: Insufficient documentation

## 2014-10-12 DIAGNOSIS — F411 Generalized anxiety disorder: Secondary | ICD-10-CM | POA: Insufficient documentation

## 2014-10-12 DIAGNOSIS — E7800 Pure hypercholesterolemia, unspecified: Secondary | ICD-10-CM | POA: Insufficient documentation

## 2014-10-12 DIAGNOSIS — F32A Depression, unspecified: Secondary | ICD-10-CM | POA: Insufficient documentation

## 2014-10-13 ENCOUNTER — Ambulatory Visit (INDEPENDENT_AMBULATORY_CARE_PROVIDER_SITE_OTHER): Payer: BLUE CROSS/BLUE SHIELD | Admitting: Physician Assistant

## 2014-10-13 ENCOUNTER — Encounter: Payer: Self-pay | Admitting: Physician Assistant

## 2014-10-13 VITALS — BP 120/78 | HR 75 | Temp 97.9°F | Resp 16 | Wt 136.4 lb

## 2014-10-13 DIAGNOSIS — J309 Allergic rhinitis, unspecified: Secondary | ICD-10-CM | POA: Diagnosis not present

## 2014-10-13 DIAGNOSIS — H65111 Acute and subacute allergic otitis media (mucoid) (sanguinous) (serous), right ear: Secondary | ICD-10-CM

## 2014-10-13 MED ORDER — AMOXICILLIN 125 MG/5ML PO SUSR: 125.0000 mg | Freq: Three times a day (TID) | ORAL | Status: DC

## 2014-10-13 NOTE — Patient Instructions (Signed)
Otitis Media Otitis media is redness, soreness, and inflammation of the middle ear. Otitis media may be caused by allergies or, most commonly, by infection. Often it occurs as a complication of the common cold. SIGNS AND SYMPTOMS Symptoms of otitis media may include:  Earache.  Fever.  Ringing in your ear.  Headache.  Leakage of fluid from the ear. DIAGNOSIS To diagnose otitis media, your health care provider will examine your ear with an otoscope. This is an instrument that allows your health care provider to see into your ear in order to examine your eardrum. Your health care provider also will ask you questions about your symptoms. TREATMENT  Typically, otitis media resolves on its own within 3-5 days. Your health care provider may prescribe medicine to ease your symptoms of pain. If otitis media does not resolve within 5 days or is recurrent, your health care provider may prescribe antibiotic medicines if he or she suspects that a bacterial infection is the cause. HOME CARE INSTRUCTIONS   If you were prescribed an antibiotic medicine, finish it all even if you start to feel better.  Take medicines only as directed by your health care provider.  Keep all follow-up visits as directed by your health care provider. SEEK MEDICAL CARE IF:  You have otitis media only in one ear, or bleeding from your nose, or both.  You notice a lump on your neck.  You are not getting better in 3-5 days.  You feel worse instead of better. SEEK IMMEDIATE MEDICAL CARE IF:   You have pain that is not controlled with medicine.  You have swelling, redness, or pain around your ear or stiffness in your neck.  You notice that part of your face is paralyzed.  You notice that the bone behind your ear (mastoid) is tender when you touch it. MAKE SURE YOU:   Understand these instructions.  Will watch your condition.  Will get help right away if you are not doing well or get worse. Document Released:  01/19/2004 Document Revised: 08/30/2013 Document Reviewed: 11/10/2012 [facility] Patient Information 2015 [place], [place]. This information is not intended to replace advice given to you by your health care provider. Make sure you discuss any questions you have with your health care provider. Allergic Rhinitis Allergic rhinitis is when the mucous membranes in the nose respond to allergens. Allergens are particles in the air that cause your body to have an allergic reaction. This causes you to release allergic antibodies. Through a chain of events, these eventually cause you to release histamine into the blood stream. Although meant to protect the body, it is this release of histamine that causes your discomfort, such as frequent sneezing, congestion, and an itchy, runny nose.  CAUSES  Seasonal allergic rhinitis (hay fever) is caused by pollen allergens that may come from grasses, trees, and weeds. Year-round allergic rhinitis (perennial allergic rhinitis) is caused by allergens such as house dust mites, pet dander, and mold spores.  SYMPTOMS   Nasal stuffiness (congestion).  Itchy, runny nose with sneezing and tearing of the eyes. DIAGNOSIS  Your health care provider can help you determine the allergen or allergens that trigger your symptoms. If you and your health care provider are unable to determine the allergen, skin or blood testing may be used. TREATMENT  Allergic rhinitis does not have a cure, but it can be controlled by:  Medicines and allergy shots (immunotherapy).  Avoiding the allergen. Hay fever may often be treated with antihistamines in pill or nasal  spray forms. Antihistamines block the effects of histamine. There are over-the-counter medicines that may help with nasal congestion and swelling around the eyes. Check with your health care provider before taking or giving this medicine.  If avoiding the allergen or the medicine prescribed do not work, there are many new medicines your  health care provider can prescribe. Stronger medicine may be used if initial measures are ineffective. Desensitizing injections can be used if medicine and avoidance does not work. Desensitization is when a patient is given ongoing shots until the body becomes less sensitive to the allergen. Make sure you follow up with your health care provider if problems continue. HOME CARE INSTRUCTIONS It is not possible to completely avoid allergens, but you can reduce your symptoms by taking steps to limit your exposure to them. It helps to know exactly what you are allergic to so that you can avoid your specific triggers. SEEK MEDICAL CARE IF:   You have a fever.  You develop a cough that does not stop easily (persistent).  You have shortness of breath.  You start wheezing.  Symptoms interfere with normal daily activities. Document Released: 01/08/2001 Document Revised: 04/20/2013 Document Reviewed: 12/21/2012 [facility] Patient Information 2015 [place], [place]. This information is not intended to replace advice given to you by your health care provider. Make sure you discuss any questions you have with your health care provider.

## 2014-10-13 NOTE — Progress Notes (Signed)
Patient: Teresa Duarte Female    DOB: Mar 14, 1951   64 y.o.   MRN: [id number] Visit Date: 10/13/2014  Today's Provider: Mar Daring, PA-C   Chief Complaint  Patient presents with  . Cough  . Ear Pain    Right   Subjective:    HPI Comments: She does mention there have been some people out farming their land and cutting down trees and grass over the last month as well and that her husband had similar symptoms.  Cough This is a new problem. The current episode started more than 1 month ago. The problem has been unchanged. The problem occurs constantly. The cough is productive of purulent sputum. Associated symptoms include ear pain, nasal congestion, postnasal drip and rhinorrhea. Pertinent negatives include no chest pain, chills, eye redness, fever, headaches, sore throat, shortness of breath or wheezing. Associated symptoms comments: Right ear pain. The symptoms are aggravated by other (Worse in the morning). The treatment provided no relief. Her past medical history is significant for environmental allergies.      Previous Medications   ASPIRIN 81 MG TABLET    Take 81 mg by mouth 3 (three) times a week.   CALCIUM PO    Take by mouth daily.   CHLORDIAZEPOXIDE (LIBRIUM) 25 MG CAPSULE    Take 25 mg by mouth at bedtime as needed (sleep).    CHOLECALCIFEROL (VITAMIN D-3) 1000 UNITS CAPS    Take 1 capsule by mouth daily.   COENZYME Q10 (COQ10) 100 MG CAPS    Take 1 capsule by mouth daily.   CONJUGATED ESTROGENS (PREMARIN) VAGINAL CREAM    Apply a half of gram of cream vaginally three times weekly.   DESVENLAFAXINE (PRISTIQ) 50 MG 24 HR TABLET    Take 50 mg by mouth daily.   DEXILANT 60 MG CAPSULE    Take 60 mg by mouth daily.    DILTIAZEM (DILACOR XR) 180 MG 24 HR CAPSULE    Take 360 mg by mouth daily.    FLUOROURACIL (EFUDEX) 5 % CREAM    Apply vaginally as directed.   VALACYCLOVIR (VALTREX) 500 MG TABLET    Take 500 mg by mouth daily. Takes 1/2 tab qd   VITAMIN B-12 (CYANOCOBALAMIN)  1000 MCG TABLET    Take 1,000 mcg by mouth daily.    Review of Systems  Constitutional: Negative for fever, chills, activity change, appetite change and fatigue.  HENT: Positive for congestion, ear pain, hearing loss, postnasal drip, rhinorrhea and sinus pressure. Negative for ear discharge, nosebleeds, sneezing, sore throat, tinnitus, trouble swallowing and voice change.   Eyes: Negative for photophobia, discharge, redness and visual disturbance.  Respiratory: Positive for cough. Negative for choking, chest tightness, shortness of breath and wheezing.   Cardiovascular: Negative for chest pain.  Allergic/Immunologic: Positive for environmental allergies.  Neurological: Negative for headaches.    History  Substance Use Topics  . Smoking status: Never Smoker   . Smokeless tobacco: Never Used  . Alcohol Use: 0.0 oz/week    0 Standard drinks or equivalent per week   Objective:   BP 120/78 mmHg  Pulse 75  Temp(Src) 97.9 F (36.6 C) (Oral)  Resp 16  Wt 136 lb 6.4 oz (61.871 kg)  SpO2 99%  Physical Exam  Constitutional: She appears well-developed and well-nourished. No distress.  HENT:  Head: Normocephalic and atraumatic.  Right Ear: Tympanic membrane and external ear normal. No drainage, swelling or tenderness. No mastoid tenderness. No middle ear effusion. Decreased hearing is noted.  Left Ear: Tympanic membrane, external ear and ear canal normal. No drainage, swelling or tenderness. No mastoid tenderness.  No middle ear effusion. Decreased hearing is noted.  Nose: Rhinorrhea present. No mucosal edema or sinus tenderness. Right sinus exhibits no maxillary sinus tenderness and no frontal sinus tenderness. Left sinus exhibits no maxillary sinus tenderness and no frontal sinus tenderness.  Mouth/Throat: Uvula is midline, oropharynx is clear and moist and mucous membranes are normal. No oropharyngeal exudate.  In the right external canal there is a small area at approx 10 o'clock that  looked like a small abrasion.  The 6 o'clock area just in front of the TM was erythematous.  Eyes: Conjunctivae are normal. Pupils are equal, round, and reactive to light. Right eye exhibits no discharge. Left eye exhibits no discharge.  Neck: Normal range of motion. Neck supple. No tracheal deviation present. No thyromegaly present.  Cardiovascular: Normal rate, regular rhythm and intact distal pulses.  Exam reveals no gallop and no friction rub.   No murmur heard. Pulmonary/Chest: Effort normal and breath sounds normal. No respiratory distress. She has no wheezes. She has no rales. She exhibits no tenderness.  Lymphadenopathy:    She has no cervical adenopathy.  Skin: She is not diaphoretic.  Vitals reviewed.       Assessment & Plan:     1. Allergic sinusitis Advised to start loratadine for better symptom control.  2. Acute allergic otitis media of right ear, recurrence not specified Amoxicillin was prescribed for possible early stage OME.     Follow up: Return if symptoms worsen or fail to improve.

## 2014-10-24 ENCOUNTER — Other Ambulatory Visit: Payer: Self-pay

## 2014-10-24 MED ORDER — DESVENLAFAXINE SUCCINATE ER 50 MG PO TB24: 50.0000 mg | ORAL_TABLET | Freq: Every day | ORAL | Status: DC

## 2014-11-11 ENCOUNTER — Ambulatory Visit: Payer: BLUE CROSS/BLUE SHIELD | Attending: Gynecology | Admitting: Gynecology

## 2014-11-11 ENCOUNTER — Encounter: Payer: Self-pay | Admitting: Gynecology

## 2014-11-11 ENCOUNTER — Other Ambulatory Visit (HOSPITAL_COMMUNITY)
Admission: RE | Admit: 2014-11-11 | Discharge: 2014-11-11 | Disposition: A | Discharge status: Home / Self Care | Payer: BLUE CROSS/BLUE SHIELD | Source: Ambulatory Visit | Attending: Gynecology | Admitting: Gynecology

## 2014-11-11 DIAGNOSIS — Z01411 Encounter for gynecological examination (general) (routine) with abnormal findings: Secondary | ICD-10-CM | POA: Insufficient documentation

## 2014-11-11 DIAGNOSIS — Z1151 Encounter for screening for human papillomavirus (HPV): Secondary | ICD-10-CM | POA: Diagnosis present

## 2014-11-11 DIAGNOSIS — N891 Moderate vaginal dysplasia: Secondary | ICD-10-CM

## 2014-11-11 NOTE — Progress Notes (Signed)
Consult Note: Gyn-Onc   Teresa Duarte 64 y.o. female  Chief Complaint  Patient presents with  . 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 Status post 2 cycles of intravaginal Efudex  Plan: Pap smears obtained. We will contact patient with report and if it is normal plan on repeating it in 6 months. If the Pap smear shows persistent V AIN 2 we will attempt a second 2 month cycle of Efudex.  Interval history: Patient returns today for initial follow-up having undergone 2 cycles (4 days each) of intravaginal Efudex over 2 months. She tolerated the therapy reasonably well with minimal vulvar irritation. She is otherwise without symptoms. HPI: 64 year old white female seen in consultation request of Dr. Delila Pereyra regarding management of biopsy-proven VAIN 2. Patient previously had ASCUS Pap smears and has been evaluated previously with colposcopy. 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  Patient has a past history of a hysterectomy for menorrhagia associated with fibroids. She claims that she had not had abnormal Pap smears prior to the hysterectomy. She is aware that HPV is the primary cause of these abnormal Pap smears although she understands that the time course of transformation could take many years.  On colposcopic exam the lesions were multifocal and therefore we treated with intravaginal Efudex.  Review of Systems:10 point review of systems is negative except as noted in interval history.   Vitals: Blood pressure 129/72, pulse 70, temperature 98 F (36.7 C), temperature source Oral, resp. rate 20, height 5\' 3"  (1.6 m), weight 136 lb 14.4 oz (62.097 kg), SpO2 99 %.  Physical Exam: General : The patient is a healthy woman in no acute distress.  HEENT: normocephalic,  extraoccular movements normal; neck is supple without thyromegally  Lynphnodes: Supraclavicular and inguinal nodes not enlarged  Abdomen: Soft, non-tender, no ascites, no organomegally, no masses, no hernias  Pelvic:  EGBUS: Normal female  Vagina: Normal, no lesions  Urethra and Bladder: Normal, non-tender  Cervix: Surgically absent  Uterus: Surgically absent  Bi-manual examination: Non-tender; no adenxal masses or nodularity  Rectal: normal sphincter tone, no masses, no blood  Lower extremities: No edema or varicosities. Normal range of motion   Procedure note: Colposcopy of the entire vagina was performed using green filter, acetic acid, and Lugol's solution. The biopsy site in the right upper vagina was identified. There appeared to be several small areas of white epithelium throughout the vagina.     Allergies  Allergen Reactions  . Other Other (See Comments)    Anesthesia caused nausea and vomiting about 20 years ago    Past Medical History  Diagnosis Date  . HBP (high blood pressure)   . High cholesterol   . Anxiety   . PONV (postoperative nausea and vomiting)   . Heart murmur     doesn't give her any problems  . Depression   . Chronic kidney disease     ?spot on her left kidney  . Arthritis     Past Surgical History  Procedure Laterality Date  . Abdominal hysterectomy    . Tonsillectomy    . Shoulder surgery Right   . Foot surgery Bilateral   . Colonoscopy  2013  . Tendon repair Left     tendon/ligament repair, pt not sure which  . Cholecystectomy N/A 03/03/2014  Procedure: LAPAROSCOPIC CHOLECYSTECTOMY WITH INTRAOPERATIVE CHOLANGIOGRAM;  Surgeon: Coralie Keens, MD;  Location: [facility];  Service: General;  Laterality: N/A;    Current Outpatient Prescriptions  Medication Sig Dispense Refill  . aspirin 81 MG tablet Take 81 mg by mouth 3 (three) times a week.    Marland Kitchen CALCIUM PO Take by mouth daily.    . chlordiazePOXIDE (LIBRIUM) 25 MG capsule Take 25 mg by  mouth at bedtime as needed (sleep).     . Cholecalciferol (VITAMIN D-3) 1000 UNITS CAPS Take 1 capsule by mouth daily.    . Coenzyme Q10 (COQ10) 100 MG CAPS Take 1 capsule by mouth daily.    Marland Kitchen desvenlafaxine (PRISTIQ) 50 MG 24 hr tablet Take 1 tablet (50 mg total) by mouth daily. 90 tablet 0  . DEXILANT 60 MG capsule Take 60 mg by mouth daily.   3  . diltiazem (DILACOR XR) 180 MG 24 hr capsule Take 360 mg by mouth daily.     . valACYclovir (VALTREX) 500 MG tablet Take 500 mg by mouth daily. Takes 1/2 tab qd    . vitamin B-12 (CYANOCOBALAMIN) 1000 MCG tablet Take 1,000 mcg by mouth daily.    Marland Kitchen conjugated estrogens (PREMARIN) vaginal cream Apply a half of gram of cream vaginally three times weekly. (Patient not taking: Reported on 10/13/2014) 42.5 g 0  . fluorouracil (EFUDEX) 5 % cream Apply vaginally as directed. (Patient not taking: Reported on 11/11/2014) 40 g 0   No current facility-administered medications for this visit.    History   Social History  . Marital Status: Married    Spouse Name: N/A  . Number of Children: N/A  . Years of Education: N/A   Occupational History  . Not on file.   Social History Main Topics  . Smoking status: Never Smoker   . Smokeless tobacco: Never Used  . Alcohol Use: 0.0 oz/week    0 Standard drinks or equivalent per week  . Drug Use: No  . Sexual Activity:    Partners: Male     Comment: married, husband has a hx of liver failurem alcohol and drug use with Hep C   Other Topics Concern  . Not on file   Social History Narrative    Family History  Problem Relation Age of Onset  . Hypertension Mother   . Hypercholesterolemia Mother   . Heart attack Father   . Depression Sister   . Depression Sister   . Hypertension Sister   . Hypercholesterolemia Sister   . Depression Son   . Anxiety disorder Son       Alvino Chapel, MD 11/11/2014, 1:38 PM

## 2014-11-11 NOTE — Patient Instructions (Signed)
We will call you with the results of the pap smear from today.  Followup will be dependant upon pap results. If normal, return for repeat pap smear in 6 months. If not, the plan will be to proceed with 2 more cycles of Efudex cream.

## 2014-11-11 NOTE — Addendum Note (Signed)
Addended by: Joylene John D on: 11/11/2014 03:30 PM   Modules accepted: Orders

## 2014-11-16 LAB — CYTOLOGY - PAP

## 2014-11-23 ENCOUNTER — Telehealth: Payer: Self-pay | Admitting: *Deleted

## 2014-11-23 NOTE — Telephone Encounter (Signed)
Patient called inquiring about recent pap smear results. Per Joylene John, NP patient notified of results and instructed to followup with a repeat pap smear in 6 months. No other questions or concerns noted at this time.

## 2014-11-28 ENCOUNTER — Telehealth: Payer: Self-pay | Admitting: *Deleted

## 2014-11-28 NOTE — Telephone Encounter (Addendum)
Per Dr. Fermin Schwab, since HPV high risk was not detected on pap smear, patient can have repeat pap in 1 year. Called and notified patient of this and she states she is agreeable to followup with new MD at Dr. Roque Cash office in 1 year since he will be retiring. Per pt request, this note will be faxed to Dr. Roque Cash office along with copy of recent pap smear.

## 2014-12-19 ENCOUNTER — Other Ambulatory Visit: Payer: Self-pay

## 2014-12-19 ENCOUNTER — Ambulatory Visit (INDEPENDENT_AMBULATORY_CARE_PROVIDER_SITE_OTHER): Payer: BLUE CROSS/BLUE SHIELD | Admitting: Family Medicine

## 2014-12-19 ENCOUNTER — Encounter: Payer: Self-pay | Admitting: Family Medicine

## 2014-12-19 VITALS — BP 142/76 | HR 76 | Temp 98.0°F | Resp 12 | Wt 139.0 lb

## 2014-12-19 DIAGNOSIS — F328 Other depressive episodes: Secondary | ICD-10-CM | POA: Diagnosis not present

## 2014-12-19 DIAGNOSIS — R1011 Right upper quadrant pain: Secondary | ICD-10-CM | POA: Diagnosis not present

## 2014-12-19 DIAGNOSIS — R0789 Other chest pain: Secondary | ICD-10-CM | POA: Diagnosis not present

## 2014-12-19 DIAGNOSIS — F419 Anxiety disorder, unspecified: Secondary | ICD-10-CM

## 2014-12-19 DIAGNOSIS — K219 Gastro-esophageal reflux disease without esophagitis: Secondary | ICD-10-CM | POA: Diagnosis not present

## 2014-12-19 DIAGNOSIS — F3289 Other specified depressive episodes: Secondary | ICD-10-CM

## 2014-12-19 DIAGNOSIS — E785 Hyperlipidemia, unspecified: Secondary | ICD-10-CM

## 2014-12-19 MED ORDER — CHLORDIAZEPOXIDE HCL 25 MG PO CAPS: 25.0000 mg | ORAL_CAPSULE | Freq: Four times a day (QID) | ORAL | Status: DC | PRN

## 2014-12-19 MED ORDER — DILTIAZEM HCL ER 180 MG PO CP24: 360.0000 mg | ORAL_CAPSULE | Freq: Every day | ORAL | Status: DC

## 2014-12-19 MED ORDER — RANITIDINE HCL 150 MG PO CAPS: 150.0000 mg | ORAL_CAPSULE | Freq: Every evening | ORAL | Status: DC

## 2014-12-19 NOTE — Progress Notes (Signed)
Patient ID: Teresa Duarte, female   DOB: [birth year], 64 y.o.   MRN: [id number]    Subjective:  HPI  Chest pain: Patient states that she has had intermittent chest pains since at least June 2016. She is not sure of a pattern but states she has had increased stress. Her last episode lasted for 3 days, it is a pressure sensation and pain which starts under right breast radiates to the mid chest and then to the left side of the chest. She states she had horrible pressure the last week of June when you were not here and called but states phone receptionist did not work her in with another provider "since it was an on going issue" and Dr. Earlean Shawl could not work her in. She has taking Librium for this issue.  Prior to Admission medications   Medication Sig Start Date End Date Taking? Authorizing Provider  aspirin 81 MG tablet Take 81 mg by mouth 3 (three) times a week.    Historical Provider, MD  CALCIUM PO Take by mouth daily.    Historical Provider, MD  chlordiazePOXIDE (LIBRIUM) 25 MG capsule Take 25 mg by mouth at bedtime as needed (sleep).     Historical Provider, MD  Cholecalciferol (VITAMIN D-3) 1000 UNITS CAPS Take 1 capsule by mouth daily.    Historical Provider, MD  Coenzyme Q10 (COQ10) 100 MG CAPS Take 1 capsule by mouth daily.    Historical Provider, MD  conjugated estrogens (PREMARIN) vaginal cream Apply a half of gram of cream vaginally three times weekly. Patient not taking: Reported on 10/13/2014 07/29/14   Marti Sleigh, MD  desvenlafaxine (PRISTIQ) 50 MG 24 hr tablet Take 1 tablet (50 mg total) by mouth daily. 10/24/14   Richard Maceo Pro., MD  DEXILANT 60 MG capsule Take 60 mg by mouth daily.  07/12/14   Historical Provider, MD  diltiazem (DILACOR XR) 180 MG 24 hr capsule Take 2 capsules (360 mg total) by mouth daily. 12/19/14   Richard Maceo Pro., MD  fluorouracil (EFUDEX) 5 % cream Apply vaginally as directed. Patient not taking: Reported on 11/11/2014 07/29/14   Marti Sleigh, MD  valACYclovir (VALTREX) 500 MG tablet Take 500 mg by mouth daily. Takes 1/2 tab qd    Historical Provider, MD  vitamin B-12 (CYANOCOBALAMIN) 1000 MCG tablet Take 1,000 mcg by mouth daily.    Historical Provider, MD    Patient Active Problem List   Diagnosis Date Noted  . Alcoholic 08/65/7846  . Abdominal pain, right upper quadrant 10/12/2014  . Anxiety 10/12/2014  . 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 Essential (primary) hypertension 10/12/2014  . Clinical depression 10/12/2014  . Anxiety, generalized 10/12/2014  . Hypercholesteremia 10/12/2014  . Cannot sleep 10/12/2014  . Kidney lump 10/12/2014  . Lipoma of neck 10/12/2014  . Affective disorder, major 10/12/2014  . Awareness of heartbeats 10/12/2014  . Pancreatitis 10/12/2014  . Lump in neck 10/12/2014  . Deafness, sensorineural 10/12/2014  . Abnormal weight loss 10/12/2014  . 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 Neck mass 09/23/2013    Past Medical History  Diagnosis Date  . HBP (high blood pressure)   . High cholesterol   . Anxiety   . PONV (postoperative nausea and vomiting)   . Heart murmur     doesn't give her any problems  . Depression   . Chronic kidney disease     ?spot on her left kidney  . Arthritis     Social History  Social History  . Marital Status: Married    Spouse Name: N/A  . Number of Children: N/A  . Years of Education: N/A   Occupational History  . Not on file.   Social History Main Topics  . Smoking status: Never Smoker   . Smokeless tobacco: Never Used  . Alcohol Use: 0.0 oz/week    0 Standard drinks or equivalent per week  . Drug Use: No  . Sexual Activity:    Partners: Male     Comment: married, husband has a hx of liver failurem alcohol and drug use with Hep C   Other Topics Concern  . Not on file   Social History Narrative    Allergies  Allergen Reactions  . Other Other (See Comments)    Anesthesia caused nausea and  vomiting about 20 years ago    Review of Systems  Constitutional: Negative for fever, chills and malaise/fatigue.  Respiratory: Negative.   Cardiovascular: Negative.   Genitourinary: Negative.   Musculoskeletal: Negative.   Neurological: Negative for dizziness, tingling, weakness and headaches.  Psychiatric/Behavioral: Negative for depression.     There is no immunization history on file for this patient. Objective:  BP 142/76 mmHg  Pulse 76  Temp(Src) 98 F (36.7 C)  Resp 12  Wt 139 lb (63.05 kg)  SpO2 98%  Physical Exam  Constitutional: She is oriented to person, place, and time and well-developed, well-nourished, and in no distress.  HENT:  Head: Normocephalic.  Eyes: Conjunctivae are normal. Pupils are equal, round, and reactive to light.  Neck: Normal range of motion. Neck supple.  Cardiovascular: Normal rate, regular rhythm, normal heart sounds and intact distal pulses.   No murmur heard. Pulmonary/Chest: Effort normal and breath sounds normal. No respiratory distress. She has no wheezes. She has no rales.  Musculoskeletal: Normal range of motion. She exhibits no edema or tenderness.  Neurological: She is alert and oriented to person, place, and time. Gait normal.  Skin: Skin is warm and dry.  Psychiatric: Mood, memory, affect and judgment normal.    Lab Results  Component Value Date   WBC 4.4 02/22/2014   HGB 12.7 02/22/2014   HCT 37.5 02/22/2014   PLT 261 02/22/2014   GLUCOSE 121* 02/22/2014    CMP     Component Value Date/Time   NA 138 02/22/2014 1437   K 3.8 02/22/2014 1437   CL 100 02/22/2014 1437   CO2 24 02/22/2014 1437   GLUCOSE 121* 02/22/2014 1437   BUN 10 02/22/2014 1437   CREATININE 0.71 02/22/2014 1437   CALCIUM 9.9 02/22/2014 1437   GFRNONAA 90* 02/22/2014 1437   GFRAA >90 02/22/2014 1437    Assessment and Plan :  1. Other chest pain EKG-stable. I do not think this is cardiac related. She has increased stress. This is most likely  due to stress. Will follow. - EKG 12-Lead - ranitidine (ZANTAC) 150 MG capsule; Take 1 capsule (150 mg total) by mouth every evening.  Dispense: 90 capsule; Refill: 3 - chlordiazePOXIDE (LIBRIUM) 25 MG capsule; Take 1 capsule (25 mg total) by mouth 4 (four) times daily as needed (sleep).  Dispense: 120 capsule; Refill: 3  2. Anxiety Worsening.  3. Other depressive episodes  4. Abdominal pain, right upper quadrant Chronic. Unchanged.  5. Gastroesophageal reflux disease, esophagitis presence not specified Continue Dexilant and will add Zantac at bedtime. - ranitidine (ZANTAC) 150 MG capsule; Take 1 capsule (150 mg total) by mouth every evening.  Dispense: 90 capsule; Refill: 3  Patient was seen and examined by Dr. Eulas Post and note was scribed by Theressa Millard, RMA.    Miguel Aschoff MD [facility] 12/19/2014 4:18 PM

## 2014-12-21 LAB — CBC WITH DIFFERENTIAL/PLATELET
Basophils Absolute: 0 10*3/uL (ref 0.0–0.2)
Basos: 1 %
EOS (ABSOLUTE): 0 10*3/uL (ref 0.0–0.4)
Eos: 1 %
HEMOGLOBIN: 12.7 g/dL (ref 11.1–15.9)
Hematocrit: 38.2 % (ref 34.0–46.6)
Immature Grans (Abs): 0 10*3/uL (ref 0.0–0.1)
Immature Granulocytes: 0 %
LYMPHS: 32 %
Lymphocytes Absolute: 1.2 10*3/uL (ref 0.7–3.1)
MCH: 30.8 pg (ref 26.6–33.0)
MCHC: 33.2 g/dL (ref 31.5–35.7)
MCV: 93 fL (ref 79–97)
MONOCYTES: 8 %
Monocytes Absolute: 0.3 10*3/uL (ref 0.1–0.9)
Neutrophils Absolute: 2.1 10*3/uL (ref 1.4–7.0)
Neutrophils: 58 %
PLATELETS: 231 10*3/uL (ref 150–379)
RBC: 4.12 x10E6/uL (ref 3.77–5.28)
RDW: 12.9 % (ref 12.3–15.4)
WBC: 3.6 10*3/uL (ref 3.4–10.8)

## 2014-12-21 LAB — COMPREHENSIVE METABOLIC PANEL
ALBUMIN: 4.5 g/dL (ref 3.6–4.8)
ALK PHOS: 72 IU/L (ref 39–117)
ALT: 18 IU/L (ref 0–32)
AST: 21 IU/L (ref 0–40)
Albumin/Globulin Ratio: 2 (ref 1.1–2.5)
BUN / CREAT RATIO: 15 (ref 11–26)
BUN: 13 mg/dL (ref 8–27)
Bilirubin Total: 0.5 mg/dL (ref 0.0–1.2)
CO2: 24 mmol/L (ref 18–29)
CREATININE: 0.87 mg/dL (ref 0.57–1.00)
Calcium: 10.1 mg/dL (ref 8.7–10.3)
Chloride: 104 mmol/L (ref 97–108)
GFR, EST AFRICAN AMERICAN: 81 mL/min/{1.73_m2} (ref >59)
GFR, EST NON AFRICAN AMERICAN: 71 mL/min/{1.73_m2} (ref >59)
GLOBULIN, TOTAL: 2.3 g/dL (ref 1.5–4.5)
Glucose: 107 mg/dL — ABNORMAL HIGH (ref 65–99)
Potassium: 5.7 mmol/L — ABNORMAL HIGH (ref 3.5–5.2)
SODIUM: 144 mmol/L (ref 134–144)
TOTAL PROTEIN: 6.8 g/dL (ref 6.0–8.5)

## 2014-12-21 LAB — LIPID PANEL WITH LDL/HDL RATIO
Cholesterol, Total: 272 mg/dL — ABNORMAL HIGH (ref 100–199)
HDL: 61 mg/dL (ref >39)
LDL Calculated: 178 mg/dL — ABNORMAL HIGH (ref 0–99)
LDl/HDL Ratio: 2.9 ratio units (ref 0.0–3.2)
Triglycerides: 163 mg/dL — ABNORMAL HIGH (ref 0–149)
VLDL CHOLESTEROL CAL: 33 mg/dL (ref 5–40)

## 2014-12-21 LAB — TSH: TSH: 1.87 u[IU]/mL (ref 0.450–4.500)

## 2014-12-22 ENCOUNTER — Telehealth: Payer: Self-pay | Admitting: Family Medicine

## 2014-12-22 ENCOUNTER — Other Ambulatory Visit: Payer: Self-pay | Admitting: Emergency Medicine

## 2014-12-22 DIAGNOSIS — E875 Hyperkalemia: Secondary | ICD-10-CM

## 2014-12-22 MED ORDER — FUROSEMIDE 20 MG PO TABS: 20.0000 mg | ORAL_TABLET | Freq: Every morning | ORAL | Status: DC

## 2014-12-22 NOTE — Telephone Encounter (Signed)
Spoke with pt- informed of results

## 2014-12-22 NOTE — Telephone Encounter (Signed)
Pt is returning call.  ZV#[phone]/SW

## 2014-12-26 ENCOUNTER — Other Ambulatory Visit: Payer: Self-pay | Admitting: Family Medicine

## 2014-12-27 LAB — POTASSIUM: Potassium: 4 mmol/L (ref 3.5–5.2)

## 2015-02-16 ENCOUNTER — Encounter: Payer: Self-pay | Admitting: Family Medicine

## 2015-02-22 ENCOUNTER — Telehealth: Payer: Self-pay

## 2015-02-22 NOTE — Telephone Encounter (Signed)
Patient states she currently lost her health insurance due to her husband losing his job. Patient states Pristiq will cost over $300.00. Patient is requesting a different medication like Pristiq or if samples are available.

## 2015-02-23 NOTE — Telephone Encounter (Signed)
Patient has been advised that we will need to wait on Dr. Rosanna Randy to get back next week.  Patient said that a pharmacist recommended Effexor, stated that it was close to [place].

## 2015-02-27 ENCOUNTER — Telehealth: Payer: Self-pay | Admitting: Family Medicine

## 2015-02-27 NOTE — Telephone Encounter (Signed)
Dr. Darnell Level, I was not sure if I sent you this note last week or not.  Could you please review and let us know what you would like to do. Thanks  ED

## 2015-02-27 NOTE — Telephone Encounter (Signed)
Venlafaxine X R at 75 mg daily

## 2015-02-27 NOTE — Telephone Encounter (Signed)
[id number] 

## 2015-02-28 ENCOUNTER — Other Ambulatory Visit: Payer: Self-pay

## 2015-02-28 MED ORDER — VENLAFAXINE HCL ER 75 MG PO CP24: 75.0000 mg | ORAL_CAPSULE | Freq: Every day | ORAL | Status: DC

## 2015-02-28 MED ORDER — DILTIAZEM HCL ER 180 MG PO CP24: 360.0000 mg | ORAL_CAPSULE | Freq: Every day | ORAL | Status: DC

## 2015-02-28 NOTE — Addendum Note (Signed)
Addended by: Arnette Norris on: 02/28/2015 09:00 AM   Modules accepted: Orders

## 2015-02-28 NOTE — Telephone Encounter (Signed)
Spoke with patient, patient states she wants to wait and fill out form to see if she eligible for free RX for Pristiq with this company first and then go from there. At the end of conversation she does want to get RX for Effexor and shop around.-aa  She will bring by the form to fill out-aa

## 2015-03-06 ENCOUNTER — Ambulatory Visit: Payer: BLUE CROSS/BLUE SHIELD

## 2015-06-05 ENCOUNTER — Telehealth: Payer: Self-pay | Admitting: Family Medicine

## 2015-06-05 NOTE — Telephone Encounter (Signed)
Pt needs to talk to [facility] about some medications she is getting through [employer] medication program.  She also needs the generic rx written for generic Valtrex,   Basically she needs some written rx's so she can check prices.  She does not have insurance right now.      Her call back is   [phone].  Thanks,  [employer]

## 2015-06-06 ENCOUNTER — Other Ambulatory Visit: Payer: Self-pay

## 2015-06-06 DIAGNOSIS — R0789 Other chest pain: Secondary | ICD-10-CM

## 2015-06-06 MED ORDER — DILTIAZEM HCL ER COATED BEADS 180 MG PO TB24: ORAL_TABLET | ORAL | Status: DC

## 2015-06-06 MED ORDER — VALACYCLOVIR HCL 500 MG PO TABS: 500.0000 mg | ORAL_TABLET | Freq: Every day | ORAL | Status: DC

## 2015-06-06 MED ORDER — CHLORDIAZEPOXIDE HCL 25 MG PO CAPS: 25.0000 mg | ORAL_CAPSULE | Freq: Four times a day (QID) | ORAL | Status: DC | PRN

## 2015-06-06 NOTE — Telephone Encounter (Signed)
Spoke with patient, patient states Dr, Carren Rang retired and he was prescribing Valacyclovir for the patient and she wanted to see if we can refill that for her. She also needs Librium and Diltiazem filled (wanted tablets instead of capsules easier to swallow and cheaper cost vs capsules). She also needs Pristiq filled from assistance program and i advised patient i will call for refill today. Spoke with Dr. Rosanna Randy and he was fine with filling all of the medications listed above.-aa

## 2015-06-06 NOTE — Telephone Encounter (Signed)
Refills provided for patient-aa

## 2015-07-19 ENCOUNTER — Other Ambulatory Visit: Payer: Self-pay

## 2015-07-19 ENCOUNTER — Encounter: Payer: Self-pay | Admitting: Family Medicine

## 2015-09-05 ENCOUNTER — Telehealth: Payer: Self-pay | Admitting: Family Medicine

## 2015-09-05 NOTE — Telephone Encounter (Signed)
Called program and requested refill, order number given at the end [id number], can take up to 7 to 10 days and patient was advised-aa

## 2015-09-05 NOTE — Telephone Encounter (Signed)
Pt stated that she has been getting desvenlafaxine (PRISTIQ) 50 MG 24 hr tablet since she lost her health insurance through a program. Pt stated she needs a refill. Please advise. Thanks TNP

## 2015-09-18 IMAGING — RF DG CHOLANGIOGRAM OPERATIVE
1 series · 6 of 6 positions shown · non-contrast
Comparison: Nuclear medicine study on 01/19/2014.

CLINICAL DATA: Cholecystectomy for symptomatic biliary dyskinesia.

EXAM:
INTRAOPERATIVE CHOLANGIOGRAM
TECHNIQUE: Cholangiographic images from the C-arm fluoroscopic device were
submitted for interpretation post-operatively. Please see the
procedural report for the amount of contrast and the fluoroscopy
time utilized.

[Series 1: run · 3 acquisitions, 6 frames shown]
[im 1/3]
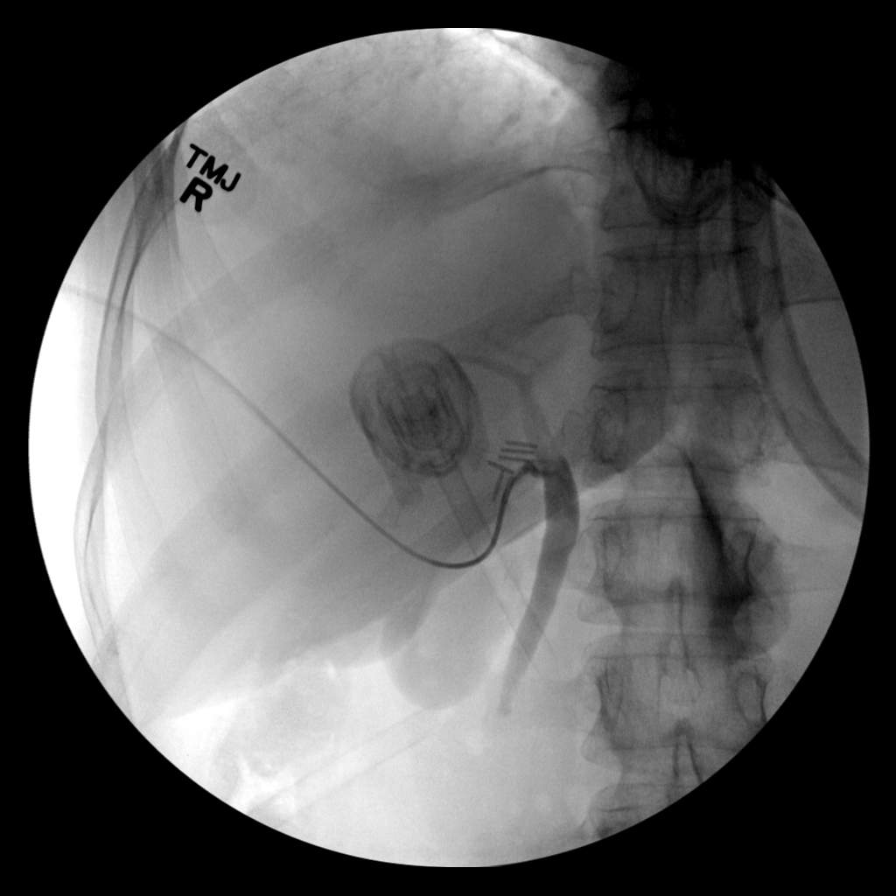
[im 1/3]
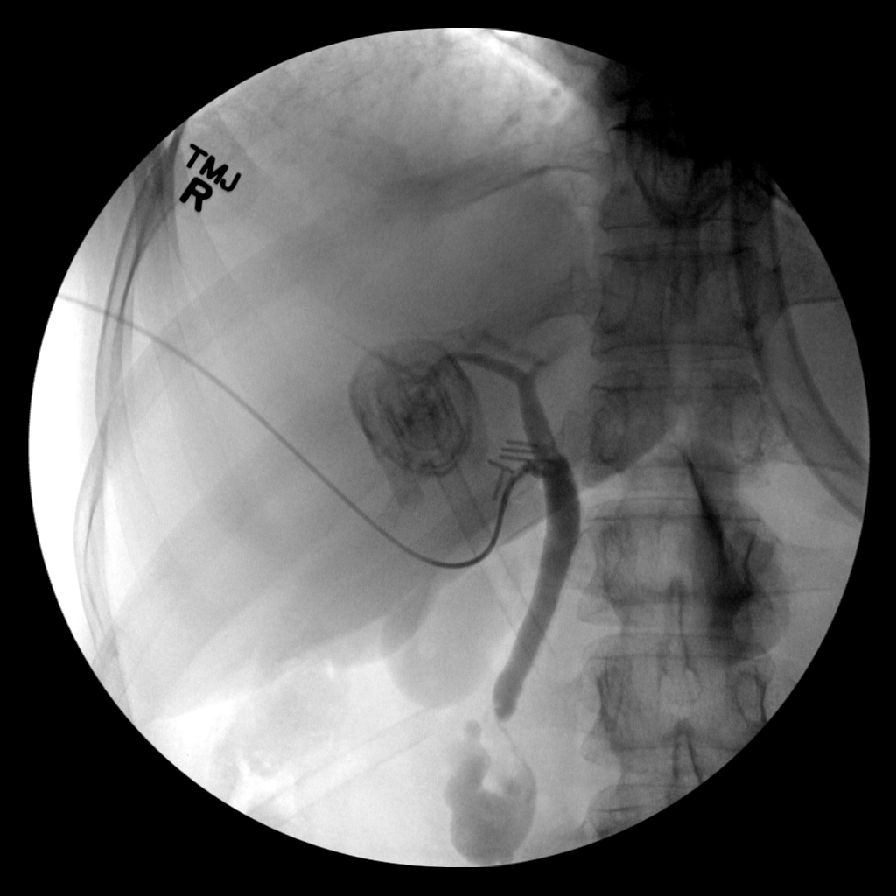
[im 1/3]
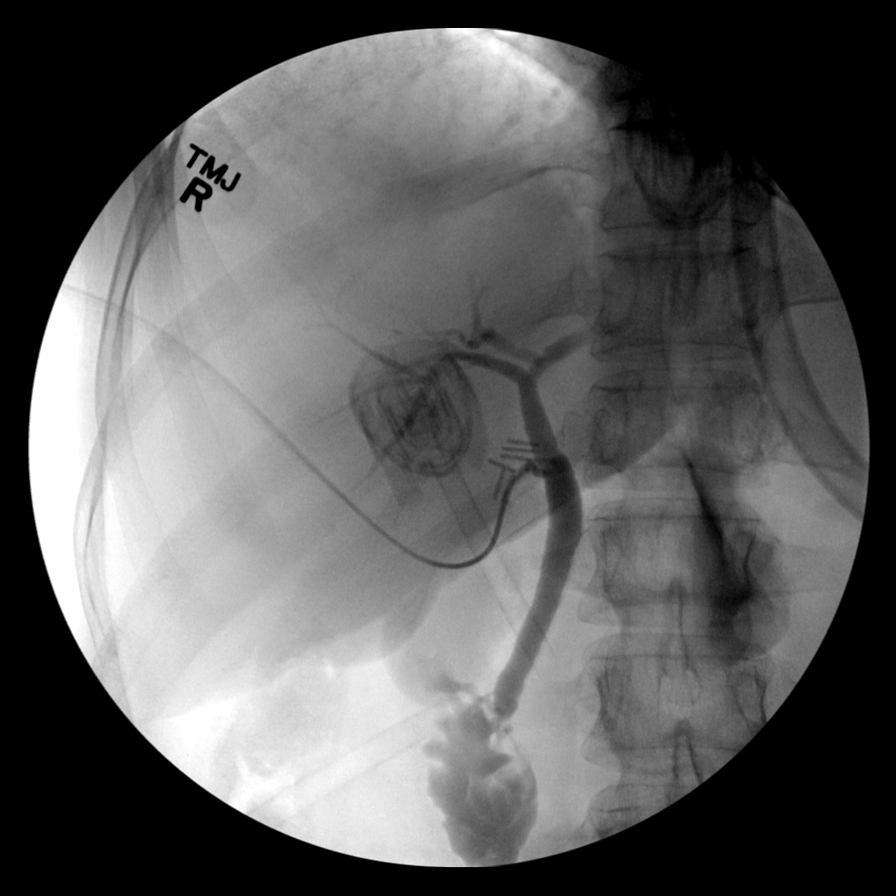
[im 1/3]
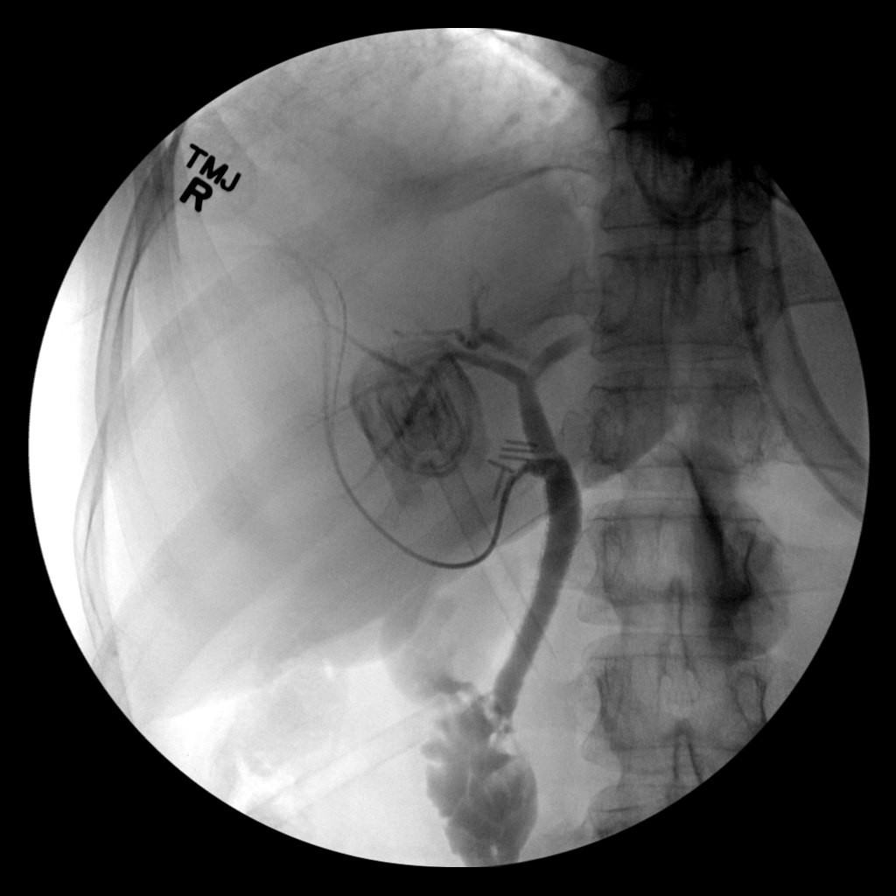
[im 2/3]
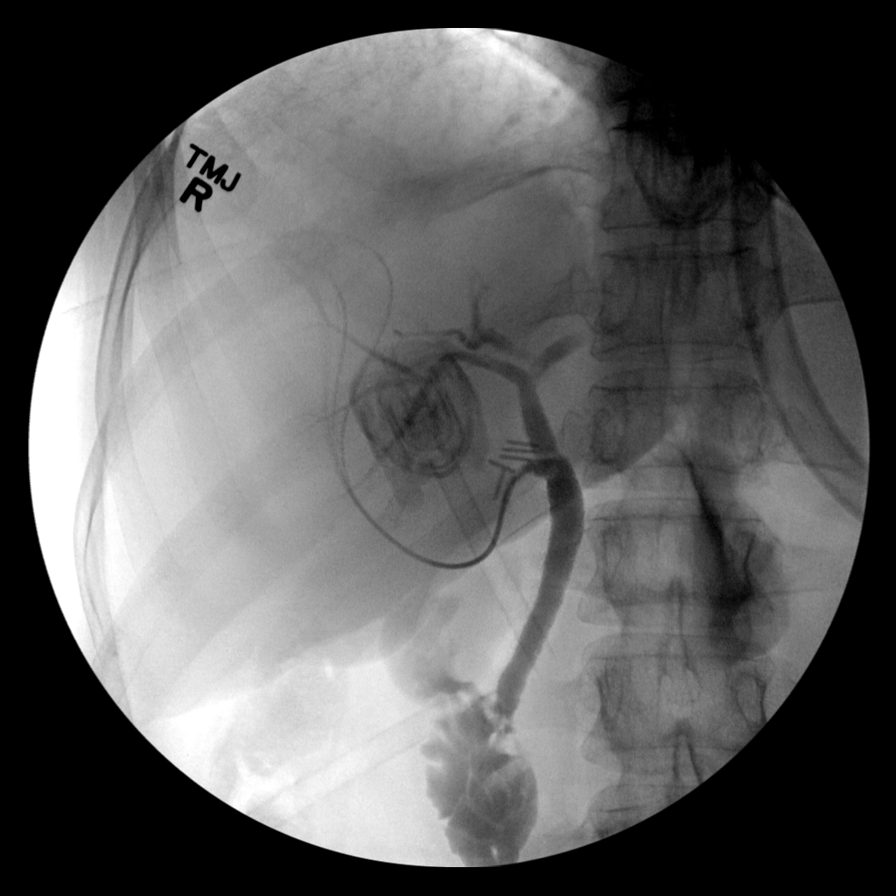
[im 3/3]
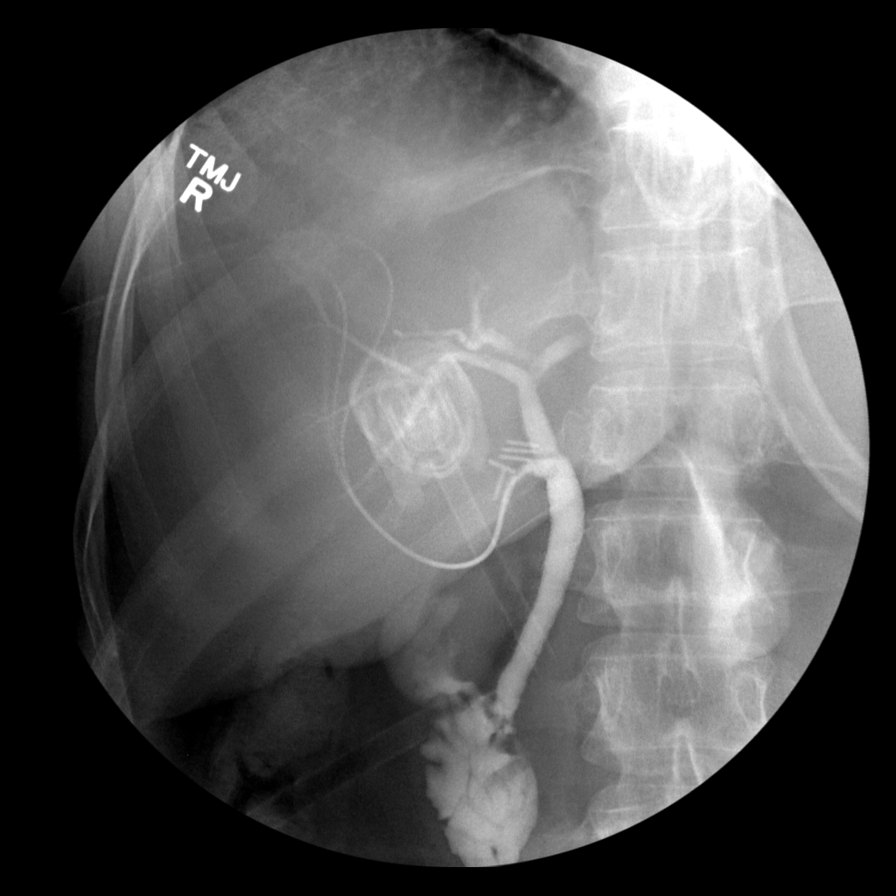

[6 of 6 positions shown; findings below may reference images not displayed]

FINDINGS: Intraoperative cholangiogram shows a normal opacified biliary tree
without evidence of filling defect or obstruction. Contrast enters
the duodenum. No evidence of contrast extravasation.
IMPRESSION: Normal intraoperative cholangiogram.

## 2015-11-02 DIAGNOSIS — L821 Other seborrheic keratosis: Secondary | ICD-10-CM | POA: Diagnosis not present

## 2015-11-02 DIAGNOSIS — L570 Actinic keratosis: Secondary | ICD-10-CM | POA: Diagnosis not present

## 2015-11-02 DIAGNOSIS — L812 Freckles: Secondary | ICD-10-CM | POA: Diagnosis not present

## 2015-11-10 ENCOUNTER — Telehealth: Payer: Self-pay

## 2015-11-10 DIAGNOSIS — K219 Gastro-esophageal reflux disease without esophagitis: Secondary | ICD-10-CM

## 2015-11-10 DIAGNOSIS — R0789 Other chest pain: Secondary | ICD-10-CM

## 2015-11-10 NOTE — Telephone Encounter (Signed)
Patient called and states she is 65 years old and has Medicare now. She is going to see Dr Leafy Ro as her gynecologist. She wanted to see if we wanted to see her also or do any labs or just to wait till she sees hey gynecologist and what all they will do.  Also would like to get RX for Zantac 150 mg 1 po QD sent to the pharmacy, it will be cheaper for her now then getting it OTC_aa

## 2015-11-10 NOTE — Telephone Encounter (Signed)
[place] for ranitadine--labs through [facility] are different than insurance--Gyn/we will order appropriate labs with visit.

## 2015-11-10 NOTE — Telephone Encounter (Signed)
She wants to know when would you like for her to come in? She did not have an appointment with [place] scheduled because she did not have an insurance before-aa

## 2015-11-13 NOTE — Telephone Encounter (Signed)
August or sept would be ok.

## 2015-11-14 MED ORDER — RANITIDINE HCL 150 MG PO CAPS: 150.0000 mg | ORAL_CAPSULE | Freq: Every evening | ORAL | Status: DC

## 2015-11-14 NOTE — Telephone Encounter (Signed)
Pt advised and RX sent in-aa 

## 2015-11-16 ENCOUNTER — Other Ambulatory Visit: Payer: Self-pay | Admitting: Obstetrics and Gynecology

## 2015-11-16 DIAGNOSIS — F4323 Adjustment disorder with mixed anxiety and depressed mood: Secondary | ICD-10-CM | POA: Diagnosis not present

## 2015-11-16 DIAGNOSIS — Z87898 Personal history of other specified conditions: Secondary | ICD-10-CM | POA: Diagnosis not present

## 2015-11-16 DIAGNOSIS — Z1231 Encounter for screening mammogram for malignant neoplasm of breast: Secondary | ICD-10-CM

## 2015-11-16 DIAGNOSIS — Z01419 Encounter for gynecological examination (general) (routine) without abnormal findings: Secondary | ICD-10-CM | POA: Diagnosis not present

## 2015-11-16 DIAGNOSIS — Z8619 Personal history of other infectious and parasitic diseases: Secondary | ICD-10-CM | POA: Diagnosis not present

## 2015-12-14 ENCOUNTER — Other Ambulatory Visit: Payer: Self-pay

## 2015-12-14 NOTE — Telephone Encounter (Signed)
Pt reports Teresa Duarte has been ordering Prestiq from [employer], and is requesting refill. Renaldo Fiddler, CMA

## 2015-12-14 NOTE — Telephone Encounter (Signed)
[employer] and re ordered Pristiq, order number is [id number]. Can take 7 to 10 days to receive order.-aa

## 2015-12-18 ENCOUNTER — Ambulatory Visit
Admission: RE | Admit: 2015-12-18 | Discharge: 2015-12-18 | Disposition: A | Discharge status: Home / Self Care | Payer: PPO | Source: Ambulatory Visit | Attending: Obstetrics and Gynecology | Admitting: Obstetrics and Gynecology

## 2015-12-18 ENCOUNTER — Other Ambulatory Visit: Payer: Self-pay | Admitting: Obstetrics and Gynecology

## 2015-12-18 DIAGNOSIS — Z1231 Encounter for screening mammogram for malignant neoplasm of breast: Secondary | ICD-10-CM | POA: Insufficient documentation

## 2016-02-07 ENCOUNTER — Ambulatory Visit (INDEPENDENT_AMBULATORY_CARE_PROVIDER_SITE_OTHER): Payer: PPO | Admitting: Family Medicine

## 2016-02-07 ENCOUNTER — Encounter: Payer: Self-pay | Admitting: Family Medicine

## 2016-02-07 VITALS — BP 140/76 | HR 92 | Temp 97.9°F | Resp 16 | Ht 62.75 in | Wt 138.0 lb

## 2016-02-07 DIAGNOSIS — Z23 Encounter for immunization: Secondary | ICD-10-CM

## 2016-02-07 DIAGNOSIS — F439 Reaction to severe stress, unspecified: Secondary | ICD-10-CM | POA: Diagnosis not present

## 2016-02-07 DIAGNOSIS — Z0000 Encounter for general adult medical examination without abnormal findings: Secondary | ICD-10-CM | POA: Diagnosis not present

## 2016-02-07 NOTE — Progress Notes (Signed)
Patient: Teresa Duarte, Female    DOB: 1950-08-10, 65 y.o.   MRN: [id number] Visit Date: 02/07/2016  Today's Provider: [name], MD   Chief Complaint  Patient presents with  . Medicare Wellness   Subjective:    Initial Annual wellness visit Teresa Duarte is a 65 y.o. female. She feels poorly (depressed and sad). She reports exercising daily. She reports she is sleeping well. Patient is tearful today as she says her husband and her son went out drinking last night came in drunk.  Last mammo- 12/18/2015- WNL Last colonoscopy- 12/06/2010- diverticulosis, internal hemorrhoids. Repeat 10 years. Last pap- 11/11/2014 with GYN per Epic. Dr. Leafy Ro at [facility]. -----------------------------------------------------------   Review of Systems  Constitutional: Positive for appetite change.  HENT: Positive for tinnitus.   Eyes: Negative.   Respiratory: Negative.   Cardiovascular: Negative.   Gastrointestinal: Positive for diarrhea (with stress).  Endocrine: Negative.   Genitourinary: Negative.   Allergic/Immunologic: Negative.   Neurological: Negative.   Hematological: Negative.   Psychiatric/Behavioral: Positive for confusion. The patient is nervous/anxious.   All other systems reviewed and are negative.   Social History   Social History  . Marital status: Married    Spouse name: N/A  . Number of children: 2  . Years of education: N/A   Occupational History  . Not on file.   Social History Main Topics  . Smoking status: Never Smoker  . Smokeless tobacco: Never Used  . Alcohol use 0.0 oz/week  . Drug use: No  . Sexual activity: Yes    Partners: Male     Comment: married, husband has a hx of liver failurem alcohol and drug use with Hep C   Other Topics Concern  . Not on file   Social History Narrative  . No narrative on file    Past Medical History:  Diagnosis Date  . Anxiety   . Arthritis   . Chronic kidney disease    ?spot on her left kidney  .  Depression   . HBP (high blood pressure)   . Heart murmur    doesn't give her any problems  . High cholesterol   . PONV (postoperative nausea and vomiting)      Patient Active Problem List   Diagnosis Date Noted  . Initial Medicare annual wellness visit 02/07/2016  . Alcoholic ([place]) [id number]  . Abdominal pain, right upper quadrant 10/12/2014  . Anxiety 10/12/2014  . 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 Essential (primary) hypertension 10/12/2014  . Clinical depression 10/12/2014  . Anxiety, generalized 10/12/2014  . Hypercholesteremia 10/12/2014  . Cannot sleep 10/12/2014  . Kidney lump 10/12/2014  . Lipoma of neck 10/12/2014  . Affective disorder, major 10/12/2014  . Awareness of heartbeats 10/12/2014  . Pancreatitis 10/12/2014  . Lump in neck 10/12/2014  . Deafness, sensorineural 10/12/2014  . Abnormal weight loss 10/12/2014  . 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 Neck mass 09/23/2013    Past Surgical History:  Procedure Laterality Date  . ABDOMINAL HYSTERECTOMY    . CHOLECYSTECTOMY N/A 03/03/2014   Procedure: LAPAROSCOPIC CHOLECYSTECTOMY WITH INTRAOPERATIVE CHOLANGIOGRAM;  Surgeon: Coralie Keens, MD;  Location: [facility];  Service: General;  Laterality: N/A;  . COLONOSCOPY  2013  . FOOT SURGERY Bilateral   . SHOULDER SURGERY Right   . TENDON REPAIR Left    tendon/ligament repair, pt not sure which  . TONSILLECTOMY      Her family history includes Anxiety disorder in her son;  Depression in her sister, sister, and son; Heart attack in her father; Hypercholesterolemia in her mother and sister; Hypertension in her mother and sister.    Current Meds  Medication Sig  . aspirin 81 MG tablet Take 81 mg by mouth 3 (three) times a week.  . chlordiazePOXIDE (LIBRIUM) 25 MG capsule Take 1 capsule (25 mg total) by mouth 4 (four) times daily as needed (sleep).  Marland Kitchen desvenlafaxine (PRISTIQ) 50 MG 24 hr tablet Take 1 tablet (50 mg total) by mouth  daily.  Marland Kitchen diltiazem (CARDIZEM LA) 180 MG 24 hr tablet 2 tablets daily  . ranitidine (ZANTAC) 150 MG capsule Take 1 capsule (150 mg total) by mouth every evening.  . valACYclovir (VALTREX) 500 MG tablet Take 1 tablet (500 mg total) by mouth daily.  . vitamin B-12 (CYANOCOBALAMIN) 1000 MCG tablet Take 1,000 mcg by mouth daily.  . [DISCONTINUED] CALCIUM PO Take by mouth daily.  . [DISCONTINUED] Cholecalciferol (VITAMIN D-3) 1000 UNITS CAPS Take 1 capsule by mouth daily.  . [DISCONTINUED] Coenzyme Q10 (COQ10) 100 MG CAPS Take 1 capsule by mouth daily.  . [DISCONTINUED] conjugated estrogens (PREMARIN) vaginal cream Apply a half of gram of cream vaginally three times weekly.  . [DISCONTINUED] DEXILANT 60 MG capsule Take 60 mg by mouth daily.   . [DISCONTINUED] fluorouracil (EFUDEX) 5 % cream Apply vaginally as directed.  . [DISCONTINUED] furosemide (LASIX) 20 MG tablet Take 1 tablet (20 mg total) by mouth every morning.  . [DISCONTINUED] venlafaxine XR (EFFEXOR XR) 75 MG 24 hr capsule Take 1 capsule (75 mg total) by mouth daily with breakfast.    Patient Care Team: Jerrol Banana., MD as PCP - General (Family Medicine) Jerrol Banana., MD (Family Medicine) Robert Bellow, MD (General Surgery)    Objective:   Vitals: BP 140/76 (BP Location: Right Arm, Patient Position: Sitting, Cuff Size: Normal)   Pulse 92   Temp 97.9 F (36.6 C) (Oral)   Resp 16   Ht 5' 2.75" (1.594 m)   Wt 138 lb (62.6 kg)   BMI 24.64 kg/m   Physical Exam  Constitutional: She is oriented to person, place, and time. She appears well-developed and well-nourished. No distress.  HENT:  Head: Normocephalic and atraumatic.  Right Ear: External ear normal.  Left Ear: External ear normal.  Nose: Nose normal.  Mouth/Throat: Oropharynx is clear and moist. No oropharyngeal exudate.  Eyes: Conjunctivae and EOM are normal. Pupils are equal, round, and reactive to light. Right eye exhibits no discharge. Left eye  exhibits no discharge.  Neck: Normal range of motion. Neck supple. No JVD present. No tracheal deviation present. No thyromegaly present.  Lipoma fullness on left supraclvicular  Cardiovascular: Normal rate and regular rhythm.   Murmur (soft 2/6 systolic murmur) heard. Pulmonary/Chest: Effort normal and breath sounds normal. No stridor. No respiratory distress. She has no wheezes. She has no rales.  Subtle asymmetry with the left supraclavicular area a little more enlarged than the right. This is been this way for years and I think this is a lipoma. Have offered referral to surgery in the past and patient always declines. She declines again today.  Abdominal: Soft. Bowel sounds are normal. She exhibits no distension. There is no tenderness.  Musculoskeletal: Normal range of motion. She exhibits no edema or tenderness.  Synovial cyst proximal area on first digit of right hand  Neurological: She is alert and oriented to person, place, and time. She has normal reflexes. She displays normal reflexes.  No cranial nerve deficit. She exhibits normal muscle tone. Coordination normal.  Skin: Skin is warm and dry. No rash noted. She is not diaphoretic. No erythema. No pallor.  Psychiatric: Her behavior is normal. Judgment and thought content normal. Her mood appears anxious (is tearful during OV. Speaks openly of problems).    Activities of Daily Living In your present state of health, do you have any difficulty performing the following activities: 02/07/2016  Hearing? Y  Vision? Y  Difficulty concentrating or making decisions? Y  Walking or climbing stairs? N  Dressing or bathing? N  Doing errands, shopping? N  Some recent data might be hidden    Fall Risk Assessment Fall Risk  02/07/2016  Falls in the past year? No     Depression Screen PHQ 2/9 Scores 02/07/2016  PHQ - 2 Score 6  PHQ- 9 Score 11     Cognitive Testing - 6-CIT  Correct? Score   What year is it? yes 0 0 or 4  What month  is it? yes 0 0 or 3  Memorize:    Pia Mau,  42,  [address]  [place],      What time is it? (within 1 hour) yes 0 0 or 3  Count backwards from 20 yes 0 0, 2, or 4  Name the months of the year yes 0 0, 2, or 4  Repeat name & address above no 2 0, 2, 4, 6, 8, or 10       TOTAL SCORE  2/28   Interpretation:  Normal  Normal (0-7) Abnormal (8-28)       Assessment & Plan:     Annual Wellness Visit  Reviewed patient's Family Medical History Reviewed and updated list of patient's medical providers Assessment of cognitive impairment was done Assessed patient's functional ability Established a written schedule for health screening [place] Completed and Reviewed  Exercise Activities and Dietary recommendations Goals    None       There is no immunization history on file for this patient.  Health Maintenance  Topic Date Due  . HIV Screening  11/03/1965  . TETANUS/TDAP  11/03/1969  . ZOSTAVAX  11/04/2010  . PNA vac Low Risk Adult (1 of 2 - PCV13) 11/04/2015  . INFLUENZA VACCINE  11/28/2015  . PAP SMEAR  11/10/2017  . MAMMOGRAM  12/17/2017  . COLONOSCOPY  12/05/2020  . DEXA SCAN  Completed  . Hepatitis C Screening  Completed      Discussed health benefits of physical activity, and encouraged her to engage in regular exercise appropriate for her age and condition.    ------------------------------------------------------------------------------------------------------------ 1. Initial Medicare annual wellness visit/Welcome to Medicare Stable. As above. Will check labs at 3 month FU. - EKG 12-Lead  2. Flu vaccine need Administered today. - Flu vaccine HIGH DOSE PF  3. Need for pneumococcal vaccination Administered today. - Pneumococcal conjugate vaccine 13-valent IM  4. Stress at home Pt tearful in office. Is going through stress at home. Per pt, husband is an alcoholic and son is an alcoholic and drug addict. Son will be 16 YO, and still  living at home with no job and no driver's license. Encouraged pt to find a temporary living situation to remove herself from these relationships. Pt refuses stating she does not have anyone to take her in. Call if needed. FU 3 months.We spent a good bit of time discussing this situation today. I think there is out of control substance abuse  and alcohol use in her home. I think she needs to move out of the home. She refuses. This morning/last night she called law enforcement but told she did not want anyone to be arrested. Have told her in the future if she feels the need to call law enforcement she needs to let them arrest whomever in the home is behaving in a way that is threatening.  Patient seen and examined by Miguel Aschoff, MD, and note scribed by Renaldo Fiddler, CMA.  I have done the exam and reviewed the above chart and it is accurate to the best of my knowledge.  Sueko Dimichele Cranford Mon, MD  [facility]

## 2016-02-19 DIAGNOSIS — H2513 Age-related nuclear cataract, bilateral: Secondary | ICD-10-CM | POA: Diagnosis not present

## 2016-02-22 ENCOUNTER — Telehealth: Payer: Self-pay | Admitting: Family Medicine

## 2016-02-22 NOTE — Telephone Encounter (Signed)
Are you willing to have patient re apply to have this medication delivered to [place] for her?-aa

## 2016-02-22 NOTE — Telephone Encounter (Signed)
Pt would like [name] to return her call to discuss reapplying to [facility] to supply desvenlafaxine (PRISTIQ) 50 MG 24 hr tablet at no cost to her. Pt stated that if that program isn't available to her any longer she would like to pick up written Rx for the medication so she can take them to the pharmacy that is cheaper at the time with the Good Rx card. Please advise. Thanks TNP

## 2016-02-22 NOTE — Telephone Encounter (Signed)
Yes but as you might imagine it is less than ideal program.

## 2016-02-23 MED ORDER — DESVENLAFAXINE SUCCINATE ER 50 MG PO TB24: 50.0000 mg | ORAL_TABLET | Freq: Every day | ORAL | 5 refills | Status: DC

## 2016-02-23 NOTE — Telephone Encounter (Signed)
Patient looked at website and does not look like with income that she would qualify. She needs RX for Pristiq so she can take it to pharmacies to see prices.-aa

## 2016-02-23 NOTE — Telephone Encounter (Signed)
Spoke with patient, called [employer] and having application faxed over to [facility]

## 2016-03-28 ENCOUNTER — Other Ambulatory Visit: Payer: Self-pay | Admitting: Family Medicine

## 2016-04-23 ENCOUNTER — Ambulatory Visit (INDEPENDENT_AMBULATORY_CARE_PROVIDER_SITE_OTHER): Payer: PPO | Admitting: Family Medicine

## 2016-04-23 ENCOUNTER — Encounter: Payer: Self-pay | Admitting: Family Medicine

## 2016-04-23 VITALS — BP 138/80 | HR 76 | Temp 98.1°F | Resp 16 | Wt 142.4 lb

## 2016-04-23 DIAGNOSIS — H1032 Unspecified acute conjunctivitis, left eye: Secondary | ICD-10-CM

## 2016-04-23 DIAGNOSIS — J0100 Acute maxillary sinusitis, unspecified: Secondary | ICD-10-CM | POA: Diagnosis not present

## 2016-04-23 MED ORDER — CIPROFLOXACIN HCL 0.3 % OP SOLN: 1.0000 [drp] | OPHTHALMIC | 1 refills | Status: AC

## 2016-04-23 MED ORDER — AMOXICILLIN 400 MG/5ML PO SUSR: 800.0000 mg | Freq: Three times a day (TID) | ORAL | 0 refills | Status: AC

## 2016-04-23 NOTE — Progress Notes (Signed)
Patient: Teresa Duarte Female    DOB: 04/27/1951   65 y.o.   MRN: [id number] Visit Date: 04/23/2016  Today's Provider: Lelon Huh, MD   Chief Complaint  Patient presents with  . Sinusitis   Subjective:    Sinusitis  This is a new problem. The current episode started 1 to 4 weeks ago (Bad cold for two weeks and she got better but on Christmas day she started with the cold). The problem is unchanged. There has been no fever. Associated symptoms include congestion, coughing, headaches, sinus pressure and sneezing. Pertinent negatives include no ear pain, shortness of breath or sore throat. Treatments tried: Claritin and fluids. The treatment provided no relief.  Conjunctivitis   The current episode started 2 days ago. The onset was sudden. The problem has been gradually worsening. Nothing relieves the symptoms. Nothing aggravates the symptoms. Associated symptoms include eye itching, congestion, headaches, cough, URI, eye discharge and eye redness. Pertinent negatives include no fever, no decreased vision, no double vision, no ear discharge, no ear pain, no rhinorrhea, no sore throat, no muscle aches and no wheezing. The eye pain is mild. The left eye is affected. The eye pain is not associated with movement. The eyelid exhibits swelling and redness.        Allergies  Allergen Reactions  . Other Other (See Comments)    Anesthesia caused nausea and vomiting about 20 years ago     Current Outpatient Prescriptions:  .  aspirin 81 MG tablet, Take 81 mg by mouth 3 (three) times a week., Disp: , Rfl:  .  chlordiazePOXIDE (LIBRIUM) 25 MG capsule, Take 1 capsule (25 mg total) by mouth 4 (four) times daily as needed (sleep)., Disp: 120 capsule, Rfl: 3 .  desvenlafaxine (PRISTIQ) 50 MG 24 hr tablet, Take 1 tablet (50 mg total) by mouth daily., Disp: 30 tablet, Rfl: 5 .  diltiazem (CARDIZEM LA) 180 MG 24 hr tablet, 2 tablets daily, Disp: 180 tablet, Rfl: 3 .  ranitidine (ZANTAC) 150 MG  capsule, Take 1 capsule (150 mg total) by mouth every evening., Disp: 30 capsule, Rfl: 12 .  valACYclovir (VALTREX) 500 MG tablet, Take 1 tablet (500 mg total) by mouth daily., Disp: 90 tablet, Rfl: 3 .  vitamin B-12 (CYANOCOBALAMIN) 1000 MCG tablet, Take 1,000 mcg by mouth daily., Disp: , Rfl:  .  DILT-XR 180 MG 24 hr capsule, TAKE TWO CAPSULES BY MOUTH DAILY, Disp: 180 capsule, Rfl: 3  Review of Systems  Constitutional: Negative for fever.  HENT: Positive for congestion, sinus pressure and sneezing. Negative for ear discharge, ear pain, rhinorrhea and sore throat.   Eyes: Positive for discharge, redness and itching. Negative for double vision.  Respiratory: Positive for cough. Negative for shortness of breath and wheezing.   Cardiovascular: Negative for chest pain, palpitations and leg swelling.  Neurological: Positive for headaches.    Social History  Substance Use Topics  . Smoking status: Never Smoker  . Smokeless tobacco: Never Used  . Alcohol use 0.0 oz/week   Objective:   BP 138/80 (BP Location: Right Arm, Patient Position: Sitting, Cuff Size: Normal)   Pulse 76   Temp 98.1 F (36.7 C) (Oral)   Resp 16   Wt 142 lb 6.4 oz (64.6 kg)   BMI 25.43 kg/m   Physical Exam  General Appearance:    Alert, cooperative, no distress  HENT:   bilateral TM normal without fluid or infection, neck without nodes, pharynx erythematous without exudate, maxillary  sinus tender and nasal mucosa pale and congested  Eyes:    PERRL, left conjunctiva/sclera red and inflamed, no discharge, right conjunctiva/corneas clear, EOM's intact       Lungs:     Clear to auscultation bilaterally, respirations unlabored  Heart:    Regular rate and rhythm  Neurologic:   Awake, alert, oriented x 3. No apparent focal neurological           defect.           Assessment & Plan:     1. Acute conjunctivitis of left eye, unspecified acute conjunctivitis type  - ciprofloxacin (CILOXAN) 0.3 % ophthalmic solution;  Place 1 drop into both eyes every 4 (four) hours while awake.  Dispense: 5 mL; Refill: 1  2. Acute non-recurrent maxillary sinusitis  - amoxicillin (AMOXIL) 400 MG/5ML suspension; Take 10 mLs (800 mg total) by mouth 3 (three) times daily.  Dispense: 210 mL; Refill: 0       Lelon Huh, MD  [facility]

## 2016-04-23 NOTE — Patient Instructions (Signed)
Viral Conjunctivitis, Adult Viral conjunctivitis is an inflammation of the clear membrane that covers the white part of your eye and the inner surface of your eyelid (conjunctiva). The inflammation is caused by a viral infection. The blood vessels in the conjunctiva become inflamed, causing the eye to become red or pink, and often itchy. Viral conjunctivitis can be easily passed from one person to another (is contagious). This condition is often called pink eye. What are the causes? This condition is caused by a virus. A virus is a type of contagious germ. It can be spread by touching objects that have been contaminated with the virus, such as doorknobs or towels. It can also be passed through droplets, such as from coughing or sneezing. What are the signs or symptoms? Symptoms of this condition include:  Eye redness.  Tearing or watery eyes.  Itchy and irritated eyes.  Burning feeling in the eyes.  Clear drainage from the eye.  Swollen eyelids.  A gritty feeling in the eye.  Light sensitivity. This condition often occurs with other symptoms, such as a fever, nausea, or a rash. How is this diagnosed? This condition is diagnosed with a medical history and physical exam. If you have discharge from your eye, the discharge may be tested to rule out other causes of conjunctivitis. How is this treated? Viral conjunctivitis does not respond to medicines that kill bacteria (antibiotics). Treatment for viral conjunctivitis is directed at stopping a bacterial infection from developing in addition to the viral infection. Treatment also aims to relieve your symptoms, such as itching. This may be done with antihistamine drops or other eye medicines. Rarely, steroid eye drops or antiviral medicines may be prescribed. Follow these instructions at home: Medicines    Take or apply over-the-counter and prescription medicines only as told by your health care provider.  Be very careful to avoid touching  the edge of the eyelid with the eye drop bottle or ointment tube when applying medicines to the affected eye. Being careful this way will stop you from spreading the infection to the other eye or to other people. Eye care   Avoid touching or rubbing your eyes.  Apply a warm, wet, clean washcloth to your eye for 10-20 minutes, 3-4 times per day or as told by your health care provider.  If you wear contact lenses, do not wear them until the inflammation is gone and your health care provider says it is safe to wear them again. Ask your health care provider how to sterilize or replace your contact lenses before using them again. Wear glasses until you can resume wearing contacts.  Avoid wearing eye makeup until the inflammation is gone. Throw away any old eye cosmetics that may be contaminated.  Gently wipe away any drainage from your eye with a warm, wet washcloth or a cotton ball. General instructions   Change or wash your pillowcase every day or as told by your health care provider.  Do not share towels, pillowcases, washcloths, eye makeup, makeup brushes, contact lenses, or glasses. This may spread the infection.  Wash your hands often with soap and water. Use paper towels to dry your hands. If soap and water are not available, use hand sanitizer.  Try to avoid contact with other people for one week or as told by your health care provider. Contact a health care provider if:  Your symptoms do not improve with treatment or they get worse.  You have increased pain.  Your vision becomes blurry.  You   have a fever.  You have facial pain, redness, or swelling.  You have yellow or green drainage coming from your eye.  You have new symptoms. This information is not intended to replace advice given to you by your health care provider. Make sure you discuss any questions you have with your health care provider. Document Released: 07/06/2002 Document Revised: 11/11/2015 Document Reviewed:  10/31/2015 Elsevier Interactive Patient Education  2017 [employer]  

## 2016-05-08 ENCOUNTER — Ambulatory Visit (INDEPENDENT_AMBULATORY_CARE_PROVIDER_SITE_OTHER): Payer: PPO | Admitting: Family Medicine

## 2016-05-08 ENCOUNTER — Encounter: Payer: Self-pay | Admitting: Family Medicine

## 2016-05-08 VITALS — BP 116/84 | HR 72 | Temp 98.3°F | Resp 16 | Wt 144.0 lb

## 2016-05-08 DIAGNOSIS — R739 Hyperglycemia, unspecified: Secondary | ICD-10-CM | POA: Diagnosis not present

## 2016-05-08 DIAGNOSIS — I10 Essential (primary) hypertension: Secondary | ICD-10-CM | POA: Diagnosis not present

## 2016-05-08 DIAGNOSIS — E7800 Pure hypercholesterolemia, unspecified: Secondary | ICD-10-CM | POA: Diagnosis not present

## 2016-05-08 DIAGNOSIS — F439 Reaction to severe stress, unspecified: Secondary | ICD-10-CM

## 2016-05-08 DIAGNOSIS — H6981 Other specified disorders of Eustachian tube, right ear: Secondary | ICD-10-CM | POA: Diagnosis not present

## 2016-05-08 NOTE — Progress Notes (Signed)
Patient: Teresa Duarte Female    DOB: [birth year]   66 y.o.   MRN: [id number] Visit Date: 05/08/2016  Today's Provider: [name], MD   Chief Complaint  Patient presents with  . Stress    FU  . Hyperglycemia  . Sinusitis    FU   Subjective:    HPI   Stress at Home Follow Up Pt was seen on 02/07/2016 for CPE. Pt was upest at that time due to her alcoholic husband and alcoholic son drinking alcohol together, and causing distress in the home. Pt reports the anxiety is improving, but still present. Pt states her son is no longer living in her house; he is now living with his girlfriend and is no longer using drugs.    Hyperglycemia, Follow-up:   Lab Results  Component Value Date   GLUCOSE 107 (H) 12/20/2014   GLUCOSE 121 (H) 02/22/2014    Last seen for for this over 1 year ago.  Management since then includes none. Current symptoms include none and have been stable.  Weight trend: increasing steadily Prior visit with dietician: no Current diet: in general, a "healthy" diet   Current exercise: has not been exercising since she was sick in November   Pt is due for labs.  Pertinent Labs:    Component Value Date/Time   CHOL 272 (H) 12/20/2014 1026   TRIG 163 (H) 12/20/2014 1026   CREATININE 0.87 12/20/2014 1026    Wt Readings from Last 3 Encounters:  05/08/16 144 lb (65.3 kg)  04/23/16 142 lb 6.4 oz (64.6 kg)  02/07/16 138 lb (62.6 kg)     Follow up for Sinusitis  The patient was last seen for this 3 weeks ago. Changes made at last visit include adding amoxicillin.  She reports excellent compliance with treatment. She feels that condition is Improved. She is not having side effects.  Pt still c/o PND and adenopathy of right side of neck.  ------------------------------------------------------------------------------------    Allergies  Allergen Reactions  . Other Other (See Comments)    Anesthesia caused nausea and vomiting about 20 years  ago     Current Outpatient Prescriptions:  .  aspirin 81 MG tablet, Take 81 mg by mouth 3 (three) times a week., Disp: , Rfl:  .  chlordiazePOXIDE (LIBRIUM) 25 MG capsule, Take 1 capsule (25 mg total) by mouth 4 (four) times daily as needed (sleep)., Disp: 120 capsule, Rfl: 3 .  desvenlafaxine (PRISTIQ) 50 MG 24 hr tablet, Take 1 tablet (50 mg total) by mouth daily., Disp: 30 tablet, Rfl: 5 .  DILT-XR 180 MG 24 hr capsule, TAKE TWO CAPSULES BY MOUTH DAILY, Disp: 180 capsule, Rfl: 3 .  ranitidine (ZANTAC) 150 MG capsule, Take 1 capsule (150 mg total) by mouth every evening., Disp: 30 capsule, Rfl: 12 .  valACYclovir (VALTREX) 500 MG tablet, Take 1 tablet (500 mg total) by mouth daily., Disp: 90 tablet, Rfl: 3 .  vitamin B-12 (CYANOCOBALAMIN) 1000 MCG tablet, Take 1,000 mcg by mouth daily., Disp: , Rfl:   Review of Systems  Constitutional: Positive for unexpected weight change (gained 5 pounds over Christmas). Negative for activity change, appetite change, chills, diaphoresis, fatigue and fever.  HENT: Positive for postnasal drip, rhinorrhea and sneezing. Negative for congestion, ear pain, sinus pain, sinus pressure, sore throat and trouble swallowing.   Respiratory: Negative for cough.   Cardiovascular: Negative for chest pain, palpitations and leg swelling.  Hematological: Positive for adenopathy.  Psychiatric/Behavioral: The  patient is not nervous/anxious.     Social History  Substance Use Topics  . Smoking status: Never Smoker  . Smokeless tobacco: Never Used  . Alcohol use 0.0 oz/week   Objective:   BP 116/84 (BP Location: Right Arm, Patient Position: Sitting, Cuff Size: Large)   Pulse 72   Temp 98.3 F (36.8 C) (Oral)   Resp 16   Wt 144 lb (65.3 kg)   BMI 25.71 kg/m   Physical Exam  Constitutional: She appears well-developed and well-nourished.  HENT:  Head: Normocephalic and atraumatic.  Right Ear: Tympanic membrane normal.  Left Ear: Tympanic membrane normal.  Neck:  Normal range of motion. Neck supple. No thyromegaly present.  Cardiovascular: Normal rate and regular rhythm.   Pulmonary/Chest: Effort normal and breath sounds normal. No respiratory distress.  Lymphadenopathy:    She has no cervical adenopathy.  Psychiatric: She has a normal mood and affect. Her behavior is normal.        Assessment & Plan:     1. Dysfunction of right eustachian tube Can use Tylenol for pain prn. Advised pt this will clear on it's own.  2. Essential (primary) hypertension Stable. Check labs. FU 6 months. - CBC with Differential/Platelet - Comprehensive metabolic panel  3. Hyperglycemia Check labs and FU pending results. - Hemoglobin A1c  4. Stress at home Improving. Pt feels safe at home. Encouraged pt to call office if needed.  5. Hypercholesteremia FU pending lab results. - TSH - Lipid panel    6. Chronic depression and anxiety Fairly good control. Patient seen and examined by Teresa Aschoff, MD, and note scribed by Teresa Duarte, CMA. I have done the exam and reviewed the above chart and it is accurate to the best of my knowledge. Development worker, community has been used in this note in any air is in the dictation or transcription are unintentional.  Teresa Durie, MD  [place]

## 2016-05-23 DIAGNOSIS — I10 Essential (primary) hypertension: Secondary | ICD-10-CM | POA: Diagnosis not present

## 2016-05-23 DIAGNOSIS — E7800 Pure hypercholesterolemia, unspecified: Secondary | ICD-10-CM | POA: Diagnosis not present

## 2016-05-23 DIAGNOSIS — R739 Hyperglycemia, unspecified: Secondary | ICD-10-CM | POA: Diagnosis not present

## 2016-05-24 ENCOUNTER — Other Ambulatory Visit: Payer: Self-pay

## 2016-05-24 ENCOUNTER — Telehealth: Payer: Self-pay

## 2016-05-24 LAB — COMPREHENSIVE METABOLIC PANEL
A/G RATIO: 1.8 (ref 1.2–2.2)
ALK PHOS: 84 IU/L (ref 39–117)
ALT: 11 IU/L (ref 0–32)
AST: 22 IU/L (ref 0–40)
Albumin: 4.8 g/dL (ref 3.6–4.8)
BILIRUBIN TOTAL: 0.4 mg/dL (ref 0.0–1.2)
BUN/Creatinine Ratio: 17 (ref 12–28)
BUN: 14 mg/dL (ref 8–27)
CHLORIDE: 102 mmol/L (ref 96–106)
CO2: 23 mmol/L (ref 18–29)
Calcium: 9.7 mg/dL (ref 8.7–10.3)
Creatinine, Ser: 0.82 mg/dL (ref 0.57–1.00)
GFR calc non Af Amer: 75 mL/min/{1.73_m2} (ref >59)
GFR, EST AFRICAN AMERICAN: 87 mL/min/{1.73_m2} (ref >59)
GLUCOSE: 98 mg/dL (ref 65–99)
Globulin, Total: 2.7 g/dL (ref 1.5–4.5)
POTASSIUM: 4.2 mmol/L (ref 3.5–5.2)
Sodium: 142 mmol/L (ref 134–144)
TOTAL PROTEIN: 7.5 g/dL (ref 6.0–8.5)

## 2016-05-24 LAB — LIPID PANEL
CHOL/HDL RATIO: 5.2 ratio — AB (ref 0.0–4.4)
Cholesterol, Total: 307 mg/dL — ABNORMAL HIGH (ref 100–199)
HDL: 59 mg/dL (ref >39)
LDL Calculated: 202 mg/dL — ABNORMAL HIGH (ref 0–99)
TRIGLYCERIDES: 230 mg/dL — AB (ref 0–149)
VLDL CHOLESTEROL CAL: 46 mg/dL — AB (ref 5–40)

## 2016-05-24 LAB — CBC WITH DIFFERENTIAL/PLATELET
BASOS ABS: 0 10*3/uL (ref 0.0–0.2)
BASOS: 1 %
EOS (ABSOLUTE): 0.1 10*3/uL (ref 0.0–0.4)
Eos: 2 %
Hematocrit: 40.7 % (ref 34.0–46.6)
Hemoglobin: 13.8 g/dL (ref 11.1–15.9)
IMMATURE GRANS (ABS): 0 10*3/uL (ref 0.0–0.1)
Immature Granulocytes: 0 %
LYMPHS: 38 %
Lymphocytes Absolute: 1.5 10*3/uL (ref 0.7–3.1)
MCH: 32.1 pg (ref 26.6–33.0)
MCHC: 33.9 g/dL (ref 31.5–35.7)
MCV: 95 fL (ref 79–97)
MONOS ABS: 0.2 10*3/uL (ref 0.1–0.9)
Monocytes: 6 %
NEUTROS ABS: 2.1 10*3/uL (ref 1.4–7.0)
NEUTROS PCT: 53 %
PLATELETS: 258 10*3/uL (ref 150–379)
RBC: 4.3 x10E6/uL (ref 3.77–5.28)
RDW: 12.6 % (ref 12.3–15.4)
WBC: 3.9 10*3/uL (ref 3.4–10.8)

## 2016-05-24 LAB — HEMOGLOBIN A1C
Est. average glucose Bld gHb Est-mCnc: 100 mg/dL
Hgb A1c MFr Bld: 5.1 % (ref 4.8–5.6)

## 2016-05-24 LAB — TSH: TSH: 2.15 u[IU]/mL (ref 0.450–4.500)

## 2016-05-24 MED ORDER — ROSUVASTATIN CALCIUM 10 MG PO TABS: 10.0000 mg | ORAL_TABLET | Freq: Every day | ORAL | 3 refills | Status: DC

## 2016-05-24 MED ORDER — ROSUVASTATIN CALCIUM 10 MG PO TABS: 10.0000 mg | ORAL_TABLET | Freq: Every day | ORAL | 12 refills | Status: DC

## 2016-05-24 NOTE — Telephone Encounter (Signed)
-----   Message from Jerrol Banana., MD sent at 05/24/2016  8:09 AM EST ----- Labs okay but cholesterol really high should consider treating with statin-Crestor 10 mg daily

## 2016-09-16 ENCOUNTER — Other Ambulatory Visit: Payer: Self-pay | Admitting: Family Medicine

## 2016-11-01 DIAGNOSIS — D225 Melanocytic nevi of trunk: Secondary | ICD-10-CM | POA: Diagnosis not present

## 2016-11-01 DIAGNOSIS — L821 Other seborrheic keratosis: Secondary | ICD-10-CM | POA: Diagnosis not present

## 2016-11-01 DIAGNOSIS — D2371 Other benign neoplasm of skin of right lower limb, including hip: Secondary | ICD-10-CM | POA: Diagnosis not present

## 2016-11-01 DIAGNOSIS — L738 Other specified follicular disorders: Secondary | ICD-10-CM | POA: Diagnosis not present

## 2016-11-01 DIAGNOSIS — D2271 Melanocytic nevi of right lower limb, including hip: Secondary | ICD-10-CM | POA: Diagnosis not present

## 2016-11-01 DIAGNOSIS — D1801 Hemangioma of skin and subcutaneous tissue: Secondary | ICD-10-CM | POA: Diagnosis not present

## 2016-11-01 DIAGNOSIS — L812 Freckles: Secondary | ICD-10-CM | POA: Diagnosis not present

## 2016-11-01 DIAGNOSIS — L918 Other hypertrophic disorders of the skin: Secondary | ICD-10-CM | POA: Diagnosis not present

## 2016-11-01 DIAGNOSIS — D2272 Melanocytic nevi of left lower limb, including hip: Secondary | ICD-10-CM | POA: Diagnosis not present

## 2016-11-05 ENCOUNTER — Ambulatory Visit (INDEPENDENT_AMBULATORY_CARE_PROVIDER_SITE_OTHER): Payer: PPO | Admitting: Family Medicine

## 2016-11-05 ENCOUNTER — Encounter: Payer: Self-pay | Admitting: Family Medicine

## 2016-11-05 VITALS — BP 138/72 | HR 80 | Temp 97.3°F | Resp 16 | Wt 144.0 lb

## 2016-11-05 DIAGNOSIS — F419 Anxiety disorder, unspecified: Secondary | ICD-10-CM | POA: Diagnosis not present

## 2016-11-05 DIAGNOSIS — E7800 Pure hypercholesterolemia, unspecified: Secondary | ICD-10-CM | POA: Diagnosis not present

## 2016-11-05 DIAGNOSIS — M79641 Pain in right hand: Secondary | ICD-10-CM

## 2016-11-05 DIAGNOSIS — I10 Essential (primary) hypertension: Secondary | ICD-10-CM | POA: Diagnosis not present

## 2016-11-05 NOTE — Progress Notes (Signed)
Subjective:  HPI  Hypertension, follow-up:  BP Readings from Last 3 Encounters:  11/05/16 138/72  05/08/16 116/84  04/23/16 138/80    She was last seen for hypertension 6 months ago.  BP at that visit was 116/84. Management since that visit includes none. She reports good compliance with treatment. She is not having side effects.  She is not exercising. She is adherent to low salt diet.   Outside blood pressures are not being checked. She is experiencing none.  Patient denies chest pain, chest pressure/discomfort, claudication, dyspnea, exertional chest pressure/discomfort, fatigue, irregular heart beat, lower extremity edema, near-syncope, orthopnea, palpitations, paroxysmal nocturnal dyspnea, syncope and tachypnea.   Cardiovascular risk factors include hypertension.   Wt Readings from Last 3 Encounters:  11/05/16 144 lb (65.3 kg)  05/08/16 144 lb (65.3 kg)  04/23/16 142 lb 6.4 oz (64.6 kg)   ------------------------------------------------------------------------   Lipid/Cholesterol, Follow-up:   Last seen for this 6 months ago.  Management changes since that visit include restarted crestor. . Last Lipid Panel:    Component Value Date/Time   CHOL 307 (H) 05/23/2016 0924   TRIG 230 (H) 05/23/2016 0924   HDL 59 05/23/2016 0924   CHOLHDL 5.2 (H) 05/23/2016 0924   LDLCALC 202 (H) 05/23/2016 2620    Risk factors for vascular disease include hypercholesterolemia and hypertension  She reports good compliance with treatment. She is not having side effects.  Current exercise: none  Wt Readings from Last 3 Encounters:  11/05/16 144 lb (65.3 kg)  05/08/16 144 lb (65.3 kg)  04/23/16 142 lb 6.4 oz (64.6 kg)   ---------------------------------------------------------     Pt reports that she is feeling well physically and emotionally. She reports that she has a trigger finger on her right hand and some knots on her joints.      Prior to Admission  medications   Medication Sig Start Date End Date Taking? Authorizing Provider  aspirin 81 MG tablet Take 81 mg by mouth 3 (three) times a week.    [provider]  chlordiazePOXIDE (LIBRIUM) 25 MG capsule Take 1 capsule (25 mg total) by mouth 4 (four) times daily as needed (sleep). 06/06/15   Jerrol Banana., MD  desvenlafaxine (PRISTIQ) 50 MG 24 hr tablet TAKE ONE TABLET BY MOUTH DAILY 09/17/16   Jerrol Banana., MD  DILT-XR 180 MG 24 hr capsule TAKE TWO CAPSULES BY MOUTH DAILY 03/28/16   Jerrol Banana., MD  ranitidine (ZANTAC) 150 MG capsule Take 1 capsule (150 mg total) by mouth every evening. 11/14/15   Jerrol Banana., MD  rosuvastatin (CRESTOR) 10 MG tablet Take 1 tablet (10 mg total) by mouth daily. 05/24/16   Jerrol Banana., MD  valACYclovir (VALTREX) 500 MG tablet Take 1 tablet (500 mg total) by mouth daily. 06/06/15   Jerrol Banana., MD  vitamin B-12 (CYANOCOBALAMIN) 1000 MCG tablet Take 1,000 mcg by mouth daily.    [provider]    Patient Active Problem List   Diagnosis Date Noted  . Hyperglycemia 05/08/2016  . Initial Medicare annual wellness visit 02/07/2016  . Stress at home 02/07/2016  . Alcoholic ([place]) 35/59/7416  . Abdominal pain, right upper quadrant 10/12/2014  . Anxiety 10/12/2014  . 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 Essential (primary) hypertension 10/12/2014  . Clinical depression 10/12/2014  . Anxiety, generalized 10/12/2014  . Hypercholesteremia 10/12/2014  . Cannot sleep 10/12/2014  . Kidney lump 10/12/2014  . Lipoma of neck  10/12/2014  . Affective disorder, major 10/12/2014  . Awareness of heartbeats 10/12/2014  . Pancreatitis 10/12/2014  . Lump in neck 10/12/2014  . Deafness, sensorineural 10/12/2014  . Abnormal weight loss 10/12/2014  . 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 Neck mass 09/23/2013    Past Medical History:  Diagnosis Date  . Anxiety   .  Arthritis   . Chronic kidney disease    ?spot on her left kidney  . Depression   . HBP (high blood pressure)   . Heart murmur    doesn't give her any problems  . High cholesterol   . PONV (postoperative nausea and vomiting)     Social History   Social History  . Marital status: Married    Spouse name: N/A  . Number of children: 2  . Years of education: N/A   Occupational History  . Not on file.   Social History Main Topics  . Smoking status: Never Smoker  . Smokeless tobacco: Never Used  . Alcohol use 0.0 oz/week     Comment: 4 glasses wine every night  . Drug use: No  . Sexual activity: Yes    Partners: Male     Comment: married, husband has a hx of liver failurem alcohol and drug use with Hep C   Other Topics Concern  . Not on file   Social History Narrative  . No narrative on file    Allergies  Allergen Reactions  . Other Other (See Comments)    Anesthesia caused nausea and vomiting about 20 years ago    Review of Systems  Constitutional: Negative.   HENT: Negative.   Eyes: Negative.   Respiratory: Negative.   Cardiovascular: Negative.   Gastrointestinal: Negative.   Genitourinary: Negative.   Musculoskeletal: Positive for joint pain (trigger finger).  Skin: Negative.   Neurological: Negative.   Endo/Heme/Allergies: Negative.   Psychiatric/Behavioral: Negative.     Immunization History  Administered Date(s) Administered  . Influenza, High Dose Seasonal PF 02/07/2016  . Pneumococcal Conjugate-13 02/07/2016    Objective:  BP 138/72 (BP Location: Left Arm, Patient Position: Sitting, Cuff Size: Normal)   Pulse 80   Temp (!) 97.3 F (36.3 C) (Oral)   Resp 16   Wt 144 lb (65.3 kg)   SpO2 98%   BMI 25.71 kg/m   Physical Exam  Constitutional: She is oriented to person, place, and time and well-developed, well-nourished, and in no distress.  Eyes: Conjunctivae and EOM are normal. Pupils are equal, round, and reactive to light.  Neck: Normal  range of motion. Neck supple.  Cardiovascular: Normal rate, regular rhythm, normal heart sounds and intact distal pulses.   Pulmonary/Chest: Effort normal and breath sounds normal.  Musculoskeletal: Normal range of motion.  Cyst to base of middle finger.   Neurological: She is alert and oriented to person, place, and time. She has normal reflexes. Gait normal. GCS score is 15.  Skin: Skin is warm and dry.  Psychiatric: Mood, memory, affect and judgment normal.    Lab Results  Component Value Date   WBC 3.9 05/23/2016   HGB 13.8 05/23/2016   HCT 40.7 05/23/2016   PLT 258 05/23/2016   GLUCOSE 98 05/23/2016   CHOL 307 (H) 05/23/2016   TRIG 230 (H) 05/23/2016   HDL 59 05/23/2016   LDLCALC 202 (H) 05/23/2016   TSH 2.150 05/23/2016   HGBA1C 5.1 05/23/2016    CMP     Component Value Date/Time   NA 142  05/23/2016 0924   K 4.2 05/23/2016 0924   CL 102 05/23/2016 0924   CO2 23 05/23/2016 0924   GLUCOSE 98 05/23/2016 0924   GLUCOSE 121 (H) 02/22/2014 1437   BUN 14 05/23/2016 0924   CREATININE 0.82 05/23/2016 0924   CALCIUM 9.7 05/23/2016 0924   PROT 7.5 05/23/2016 0924   ALBUMIN 4.8 05/23/2016 0924   AST 22 05/23/2016 0924   ALT 11 05/23/2016 0924   ALKPHOS 84 05/23/2016 0924   BILITOT 0.4 05/23/2016 0924   GFRNONAA 75 05/23/2016 0924   GFRAA 87 05/23/2016 0924    Assessment and Plan :  1. Essential (primary) hypertension Stable.  - CBC with Differential/Platelet - TSH  2. Hypercholesteremia Doing well on Crestor. Check labs.  - Lipid Panel With LDL/HDL Ratio - Comprehensive metabolic panel  3. Anxiety Stable.  4. 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 Cranford Mon, MD  Electronically Signed: Katina Dung, [place] MD [facility] 11/05/2016 11:41 AM

## 2016-11-06 DIAGNOSIS — I10 Essential (primary) hypertension: Secondary | ICD-10-CM | POA: Diagnosis not present

## 2016-11-06 DIAGNOSIS — E7800 Pure hypercholesterolemia, unspecified: Secondary | ICD-10-CM | POA: Diagnosis not present

## 2016-11-07 LAB — LIPID PANEL WITH LDL/HDL RATIO
Cholesterol, Total: 212 mg/dL — ABNORMAL HIGH (ref 100–199)
HDL: 64 mg/dL (ref >39)
LDL CALC: 116 mg/dL — AB (ref 0–99)
LDl/HDL Ratio: 1.8 ratio (ref 0.0–3.2)
Triglycerides: 161 mg/dL — ABNORMAL HIGH (ref 0–149)
VLDL CHOLESTEROL CAL: 32 mg/dL (ref 5–40)

## 2016-11-07 LAB — COMPREHENSIVE METABOLIC PANEL
A/G RATIO: 2 (ref 1.2–2.2)
ALK PHOS: 76 IU/L (ref 39–117)
ALT: 15 IU/L (ref 0–32)
AST: 22 IU/L (ref 0–40)
Albumin: 4.9 g/dL — ABNORMAL HIGH (ref 3.6–4.8)
BUN/Creatinine Ratio: 15 (ref 12–28)
BUN: 12 mg/dL (ref 8–27)
Bilirubin Total: 0.5 mg/dL (ref 0.0–1.2)
CALCIUM: 9.9 mg/dL (ref 8.7–10.3)
CO2: 20 mmol/L (ref 20–29)
Chloride: 103 mmol/L (ref 96–106)
Creatinine, Ser: 0.8 mg/dL (ref 0.57–1.00)
GFR calc Af Amer: 89 mL/min/{1.73_m2} (ref >59)
GFR, EST NON AFRICAN AMERICAN: 77 mL/min/{1.73_m2} (ref >59)
GLOBULIN, TOTAL: 2.4 g/dL (ref 1.5–4.5)
Glucose: 93 mg/dL (ref 65–99)
POTASSIUM: 4.4 mmol/L (ref 3.5–5.2)
SODIUM: 142 mmol/L (ref 134–144)
Total Protein: 7.3 g/dL (ref 6.0–8.5)

## 2016-11-07 LAB — CBC WITH DIFFERENTIAL/PLATELET
Basophils Absolute: 0 10*3/uL (ref 0.0–0.2)
Basos: 1 %
EOS (ABSOLUTE): 0.1 10*3/uL (ref 0.0–0.4)
Eos: 2 %
Hematocrit: 39.8 % (ref 34.0–46.6)
Hemoglobin: 13.6 g/dL (ref 11.1–15.9)
IMMATURE GRANULOCYTES: 0 %
Immature Grans (Abs): 0 10*3/uL (ref 0.0–0.1)
LYMPHS ABS: 1.2 10*3/uL (ref 0.7–3.1)
Lymphs: 29 %
MCH: 31.6 pg (ref 26.6–33.0)
MCHC: 34.2 g/dL (ref 31.5–35.7)
MCV: 92 fL (ref 79–97)
MONOS ABS: 0.2 10*3/uL (ref 0.1–0.9)
Monocytes: 6 %
NEUTROS PCT: 62 %
Neutrophils Absolute: 2.6 10*3/uL (ref 1.4–7.0)
PLATELETS: 235 10*3/uL (ref 150–379)
RBC: 4.31 x10E6/uL (ref 3.77–5.28)
RDW: 13.3 % (ref 12.3–15.4)
WBC: 4.1 10*3/uL (ref 3.4–10.8)

## 2016-11-07 LAB — TSH: TSH: 1.76 u[IU]/mL (ref 0.450–4.500)

## 2016-11-19 ENCOUNTER — Other Ambulatory Visit: Payer: Self-pay | Admitting: Obstetrics and Gynecology

## 2016-11-19 DIAGNOSIS — M8580 Other specified disorders of bone density and structure, unspecified site: Secondary | ICD-10-CM | POA: Diagnosis not present

## 2016-11-19 DIAGNOSIS — Z1231 Encounter for screening mammogram for malignant neoplasm of breast: Secondary | ICD-10-CM | POA: Diagnosis not present

## 2016-11-19 DIAGNOSIS — Z124 Encounter for screening for malignant neoplasm of cervix: Secondary | ICD-10-CM | POA: Diagnosis not present

## 2016-11-28 DIAGNOSIS — M810 Age-related osteoporosis without current pathological fracture: Secondary | ICD-10-CM | POA: Diagnosis not present

## 2016-11-28 DIAGNOSIS — M8589 Other specified disorders of bone density and structure, multiple sites: Secondary | ICD-10-CM | POA: Diagnosis not present

## 2016-11-29 ENCOUNTER — Telehealth: Payer: Self-pay | Admitting: Family Medicine

## 2016-11-29 NOTE — Telephone Encounter (Signed)
Please review

## 2016-11-29 NOTE — Telephone Encounter (Signed)
Patient had a bone density test done by Dr. Leafy Ro at [facility].   Her result showed osteoporosis and Dr. Leafy Ro does not treat that.   She advised Teresa Duarte to contact her PCP for treatment.   Please call Teresa Duarte and let her know what she needs to do from this point.

## 2016-12-02 NOTE — Telephone Encounter (Signed)
Called Dr Leafy Ro at [facility] and requested record.-aa

## 2016-12-02 NOTE — Telephone Encounter (Signed)
I just need to see BMD.

## 2016-12-09 ENCOUNTER — Other Ambulatory Visit: Payer: Self-pay | Admitting: Emergency Medicine

## 2016-12-09 ENCOUNTER — Telehealth: Payer: Self-pay | Admitting: Family Medicine

## 2016-12-09 DIAGNOSIS — M810 Age-related osteoporosis without current pathological fracture: Secondary | ICD-10-CM

## 2016-12-09 MED ORDER — ALENDRONATE SODIUM 70 MG PO TABS: 70.0000 mg | ORAL_TABLET | ORAL | 11 refills | Status: DC

## 2016-12-09 NOTE — Telephone Encounter (Signed)
Dr Rosanna Randy I placed the BMD result on green chart for you to review. Please review. Thank you-aa

## 2016-12-09 NOTE — Progress Notes (Unsigned)
Per Dr. Rosanna Randy, He reviewed her BMD, wants her to start Alendronate (fosamax) weekly. He said she doesn't need to follow up after starting it, she will need a repeat BMD in 2 years. Will plan to take Alendronate (fosamax) for 5 years. Informed pt that she needed to start Fosamax but pt was at dentist and said she would call me back. Sent in rx to [facility] already.

## 2016-12-11 NOTE — Progress Notes (Signed)
Patient advised as below-aa 

## 2016-12-16 ENCOUNTER — Other Ambulatory Visit: Payer: Self-pay | Admitting: Family Medicine

## 2016-12-18 ENCOUNTER — Ambulatory Visit
Admission: RE | Admit: 2016-12-18 | Discharge: 2016-12-18 | Disposition: A | Discharge status: Home / Self Care | Payer: PPO | Source: Ambulatory Visit | Attending: Obstetrics and Gynecology | Admitting: Obstetrics and Gynecology

## 2016-12-18 DIAGNOSIS — Z1231 Encounter for screening mammogram for malignant neoplasm of breast: Secondary | ICD-10-CM | POA: Diagnosis not present

## 2016-12-25 DIAGNOSIS — M65331 Trigger finger, right middle finger: Secondary | ICD-10-CM | POA: Diagnosis not present

## 2016-12-25 DIAGNOSIS — M67441 Ganglion, right hand: Secondary | ICD-10-CM | POA: Diagnosis not present

## 2016-12-27 ENCOUNTER — Encounter: Payer: Self-pay | Admitting: Family Medicine

## 2016-12-31 ENCOUNTER — Encounter: Payer: Self-pay | Admitting: Family Medicine

## 2017-03-22 ENCOUNTER — Other Ambulatory Visit: Payer: Self-pay | Admitting: Family Medicine

## 2017-03-31 ENCOUNTER — Other Ambulatory Visit: Payer: Self-pay | Admitting: Family Medicine

## 2017-03-31 DIAGNOSIS — R0789 Other chest pain: Secondary | ICD-10-CM

## 2017-05-05 DIAGNOSIS — H2513 Age-related nuclear cataract, bilateral: Secondary | ICD-10-CM | POA: Diagnosis not present

## 2017-05-08 ENCOUNTER — Other Ambulatory Visit: Payer: Self-pay

## 2017-05-08 ENCOUNTER — Ambulatory Visit (INDEPENDENT_AMBULATORY_CARE_PROVIDER_SITE_OTHER): Payer: PPO | Admitting: Family Medicine

## 2017-05-08 VITALS — BP 150/78 | HR 80 | Temp 97.8°F | Resp 16 | Wt 140.0 lb

## 2017-05-08 DIAGNOSIS — F1020 Alcohol dependence, uncomplicated: Secondary | ICD-10-CM | POA: Diagnosis not present

## 2017-05-08 DIAGNOSIS — F439 Reaction to severe stress, unspecified: Secondary | ICD-10-CM | POA: Diagnosis not present

## 2017-05-08 DIAGNOSIS — F419 Anxiety disorder, unspecified: Secondary | ICD-10-CM

## 2017-05-08 NOTE — Progress Notes (Signed)
Teresa Duarte  MRN: [id number] DOB: [birth year]  Subjective:  HPI   The patient is a 67 year old female who presents for 6 month follow up of her chronic issues.  She was last seen on 11/05/16.  Hypertension-Her blood pressure readings continue to be stable with no medication therapy.  BP Readings from Last 3 Encounters:  05/08/17 (!) 150/78  11/05/16 138/72  05/08/16 116/84   Hypercholesterolemia-Her last lipids were improved from previous reading.  She continues on Rosuvastatin and reports no adverse effects.  Lab Results  Component Value Date   CHOL 212 (H) 11/06/2016   CHOL 307 (H) 05/23/2016   CHOL 272 (H) 12/20/2014   Lab Results  Component Value Date   HDL 64 11/06/2016   HDL 59 05/23/2016   HDL 61 12/20/2014   Lab Results  Component Value Date   LDLCALC 116 (H) 11/06/2016   LDLCALC 202 (H) 05/23/2016   LDLCALC 178 (H) 12/20/2014   Lab Results  Component Value Date   TRIG 161 (H) 11/06/2016   TRIG 230 (H) 05/23/2016   TRIG 163 (H) 12/20/2014   Lab Results  Component Value Date   CHOLHDL 5.2 (H) 05/23/2016   No results found for: LDLDIRECT   Anxiety-Patient continues with Pristiq and Librium.  Patient would like to have referral to psychiatry.  She states that between issues with her son and husband she needs more help.    Patient Active Problem List   Diagnosis Date Noted  . Hyperglycemia 05/08/2016  . Initial Medicare annual wellness visit 02/07/2016  . Stress at home 02/07/2016  . Alcoholic ([place]) 99/83/3825  . Abdominal pain, right upper quadrant 10/12/2014  . Anxiety 10/12/2014  . 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 Essential (primary) hypertension 10/12/2014  . Clinical depression 10/12/2014  . Anxiety, generalized 10/12/2014  . Hypercholesteremia 10/12/2014  . Cannot sleep 10/12/2014  . Kidney lump 10/12/2014  . Lipoma of neck 10/12/2014  . Affective disorder, major 10/12/2014  . Awareness of heartbeats 10/12/2014  . Pancreatitis  10/12/2014  . Lump in neck 10/12/2014  . Deafness, sensorineural 10/12/2014  . Abnormal weight loss 10/12/2014  . 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 Neck mass 09/23/2013    Past Medical History:  Diagnosis Date  . Anxiety   . Arthritis   . Chronic kidney disease    ?spot on her left kidney  . Depression   . HBP (high blood pressure)   . Heart murmur    doesn't give her any problems  . High cholesterol   . PONV (postoperative nausea and vomiting)     Social History   Socioeconomic History  . Marital status: Married    Spouse name: Not on file  . Number of children: 2  . Years of education: Not on file  . Highest education level: Not on file  Social Needs  . Financial resource strain: Not on file  . Food insecurity - worry: Not on file  . Food insecurity - inability: Not on file  . Transportation needs - medical: Not on file  . Transportation needs - non-medical: Not on file  Occupational History  . Not on file  Tobacco Use  . Smoking status: Never Smoker  . Smokeless tobacco: Never Used  Substance and Sexual Activity  . Alcohol use: Yes    Alcohol/week: 0.0 oz    Comment: 4 glasses wine every night  . Drug use: No  . Sexual activity: Yes    Partners: Male  Comment: married, husband has a hx of liver failurem alcohol and drug use with Hep C  Other Topics Concern  . Not on file  Social History Narrative  . Not on file    Outpatient Encounter Medications as of 05/08/2017  Medication Sig  . alendronate (FOSAMAX) 70 MG tablet Take 1 tablet (70 mg total) by mouth every 7 (seven) days. Take with a full glass of water on an empty stomach.  . chlordiazePOXIDE (LIBRIUM) 25 MG capsule TAKE ONE CAPSULE EVERY SIX HOURS AS NEEDED  . desvenlafaxine (PRISTIQ) 50 MG 24 hr tablet TAKE ONE TABLET BY MOUTH DAILY  . DILT-XR 180 MG 24 hr capsule TAKE 2 CAPSULES BY MOUTH DAILY  . rosuvastatin (CRESTOR) 10 MG tablet Take 1 tablet (10 mg total) by  mouth daily.  . valACYclovir (VALTREX) 500 MG tablet TAKE 1 TABLET BY MOUTH DAILY  . vitamin B-12 (CYANOCOBALAMIN) 1000 MCG tablet Take 1,000 mcg by mouth daily.  . [DISCONTINUED] aspirin 81 MG tablet Take 81 mg by mouth 3 (three) times a week.  . [DISCONTINUED] ranitidine (ZANTAC) 150 MG capsule Take 1 capsule (150 mg total) by mouth every evening. (Patient not taking: Reported on 11/05/2016)   No facility-administered encounter medications on file as of 05/08/2017.     Allergies  Allergen Reactions  . Other Other (See Comments)    Anesthesia caused nausea and vomiting about 20 years ago    Review of Systems  Constitutional: Negative for fever and malaise/fatigue.  Respiratory: Negative for cough, shortness of breath and wheezing.   Cardiovascular: Negative for chest pain, palpitations, orthopnea, claudication and leg swelling.  Neurological: Negative for weakness.  Psychiatric/Behavioral: Positive for depression, memory loss and suicidal ideas (occasional thought, no plan ). Negative for hallucinations and substance abuse. The patient is nervous/anxious. The patient does not have insomnia.     Objective:  BP (!) 150/78 (BP Location: Left Arm, Patient Position: Sitting, Cuff Size: Normal)   Pulse 80   Temp 97.8 F (36.6 C) (Oral)   Resp 16   Wt 140 lb (63.5 kg)   BMI 25.00 kg/m   Physical Exam  Constitutional: She is oriented to person, place, and time and well-developed, well-nourished, and in no distress.  HENT:  Head: Normocephalic and atraumatic.  Eyes: Conjunctivae are normal.  Neck: No thyromegaly present.  Cardiovascular: Normal rate, regular rhythm and normal heart sounds.  Pulmonary/Chest: Effort normal and breath sounds normal.  Abdominal: Soft.  Neurological: She is alert and oriented to person, place, and time. Gait normal. GCS score is 15.  Skin: Skin is warm and dry.  Psychiatric: Mood, memory, affect and judgment normal.    Assessment and Plan :  1.  Anxiety  - Ambulatory referral to Psychiatry  2. Depression  - Ambulatory referral to Psychiatry  3. Stress at home Pt says husband is using drugs and son is an addict. She at [facility] not felt safe but she will not leave home and she is finally willing to accept some help. - Ambulatory referral to Psychiatry  I have done the exam and reviewed the chart and it is accurate to the best of my knowledge. Development worker, community has been used and  any errors in dictation or transcription are unintentional. Miguel Aschoff M.D. [facility]

## 2017-06-16 ENCOUNTER — Other Ambulatory Visit: Payer: Self-pay | Admitting: Family Medicine

## 2017-06-26 ENCOUNTER — Other Ambulatory Visit: Payer: Self-pay | Admitting: Emergency Medicine

## 2017-06-26 DIAGNOSIS — E7800 Pure hypercholesterolemia, unspecified: Secondary | ICD-10-CM

## 2017-06-26 MED ORDER — ROSUVASTATIN CALCIUM 10 MG PO TABS: 10.0000 mg | ORAL_TABLET | Freq: Every day | ORAL | 3 refills | Status: DC

## 2017-07-04 IMAGING — MG MM DIGITAL SCREENING BILAT W/ TOMO W/ CAD
8 of 12 series · 8 of 28 positions shown · non-contrast
Comparison: Previous exam(s).

CLINICAL DATA: Screening.

EXAM:
2D DIGITAL SCREENING BILATERAL MAMMOGRAM WITH CAD AND ADJUNCT TOMO

[R MLO]
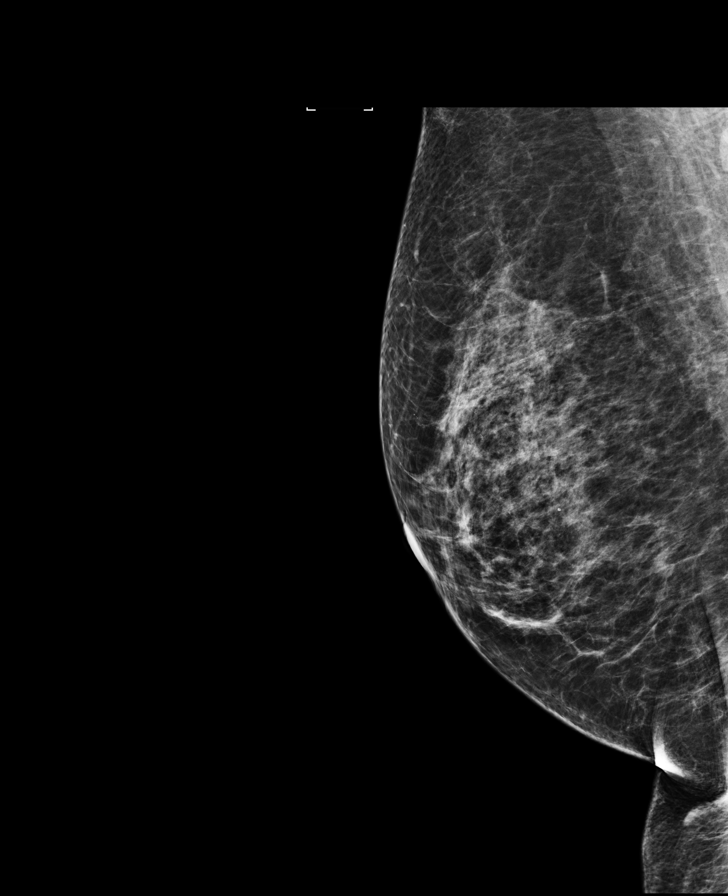

[L MLO]
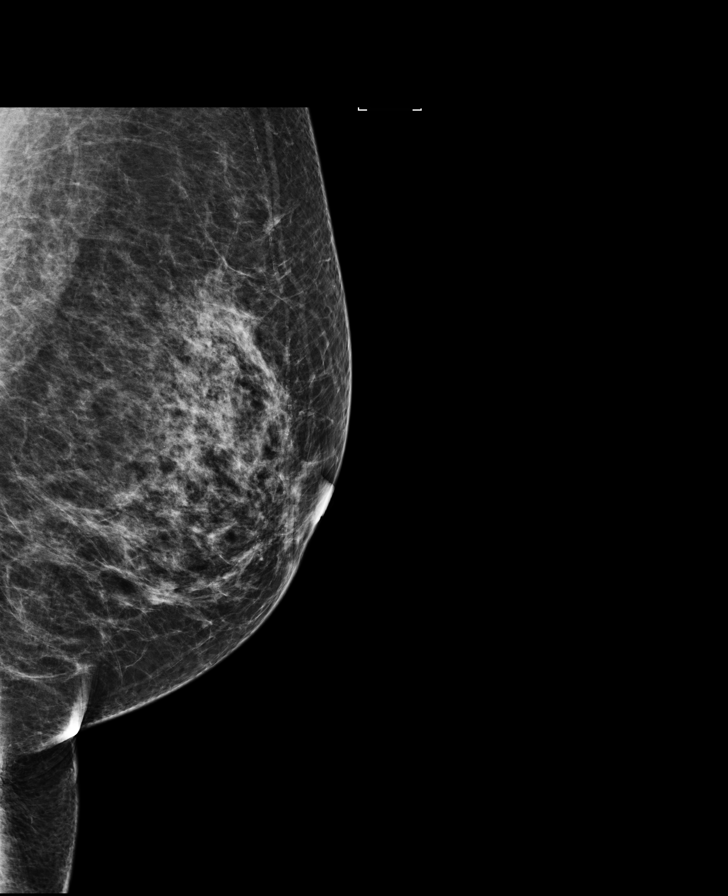

[L CC]
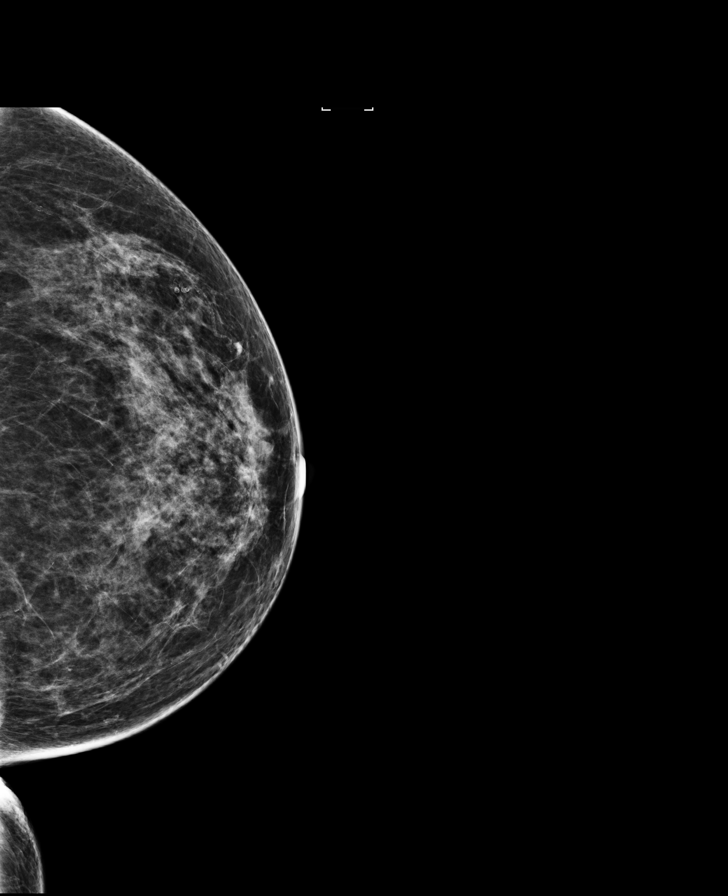

[R MLO synth-2D]
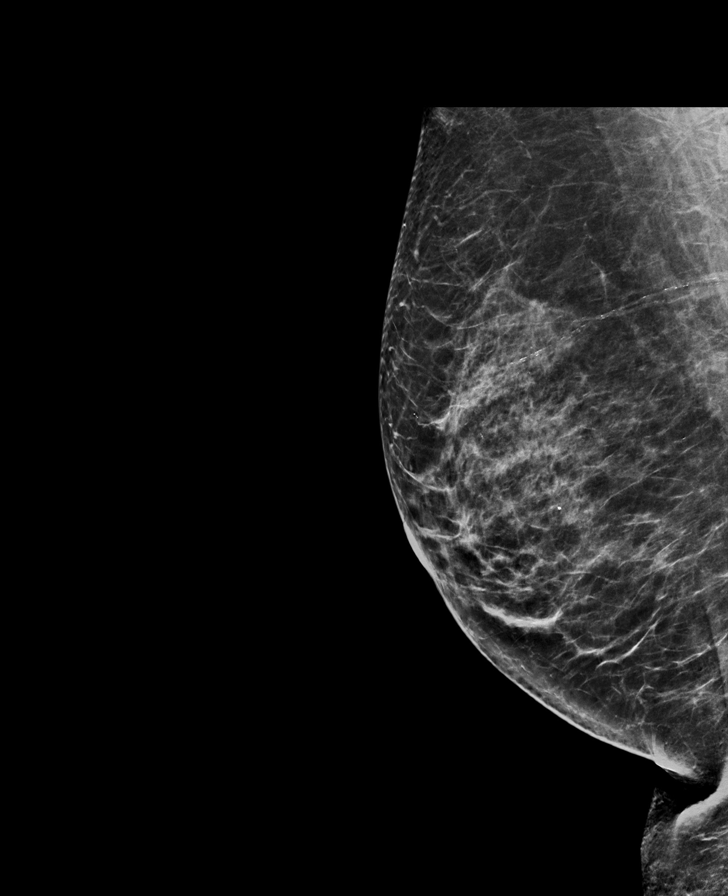

[R CC synth-2D]
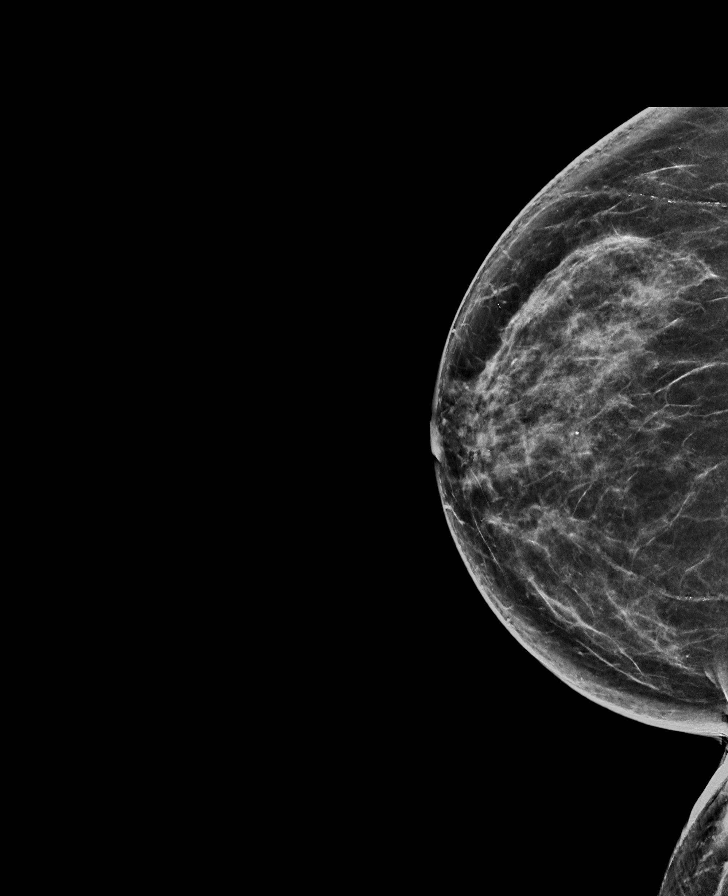

[L CC synth-2D]
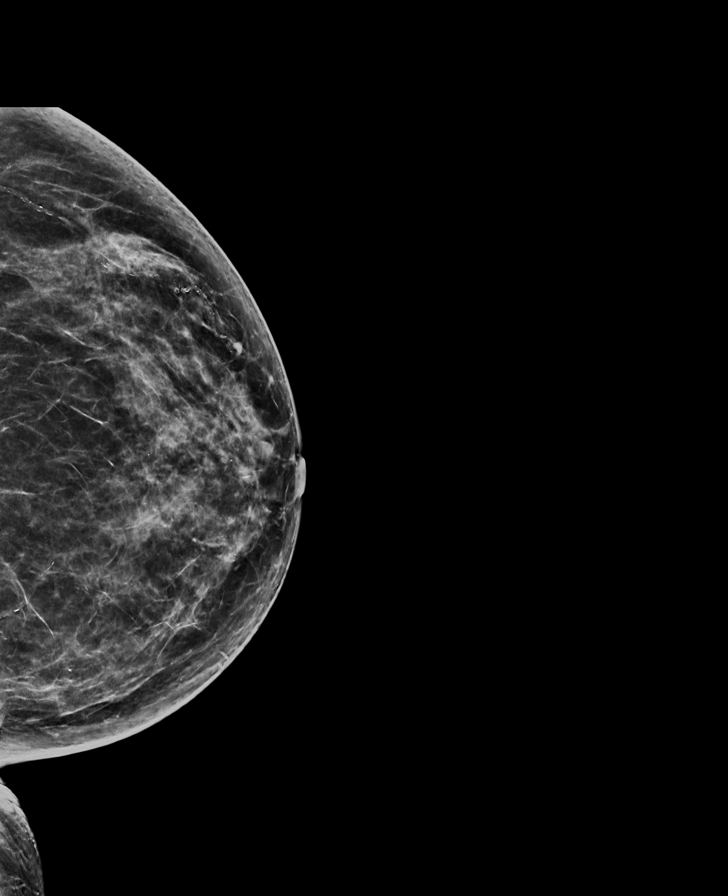

[R CC]
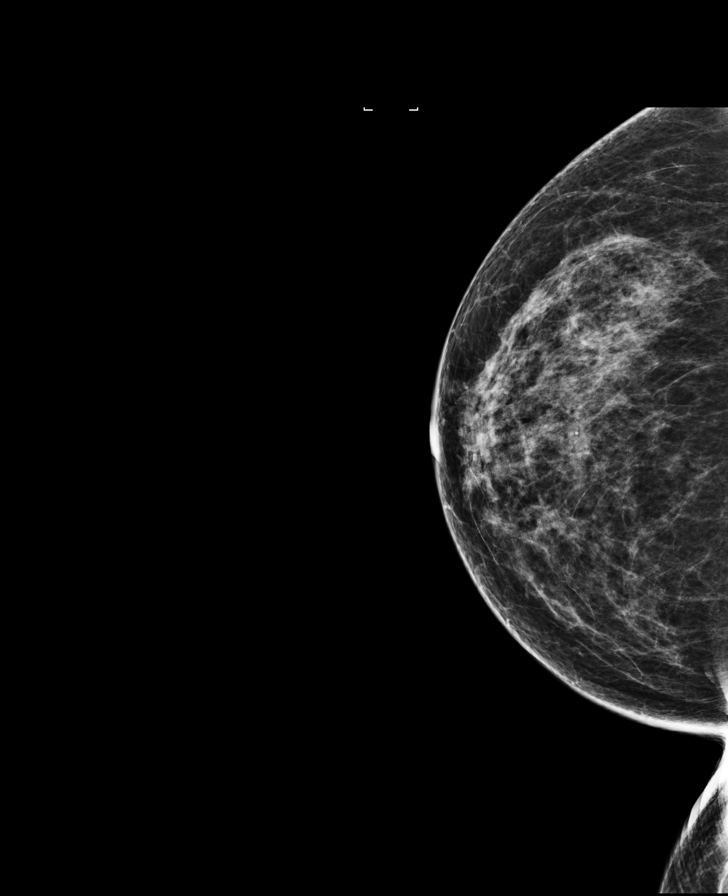

[L MLO synth-2D]
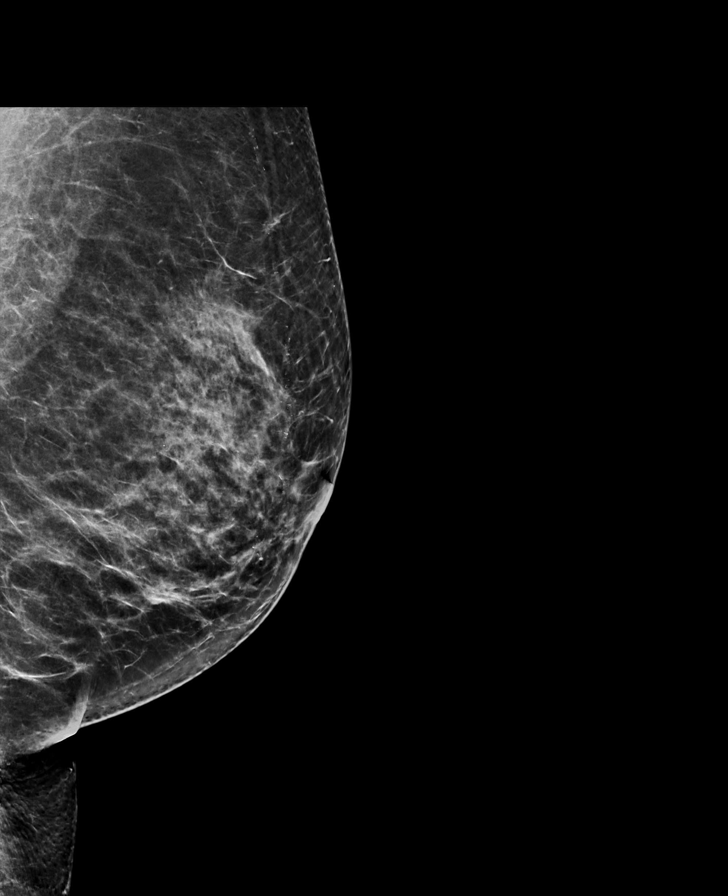

[8 of 28 positions shown; findings below may reference images not displayed]

ACR Breast Density Category c: The breast tissue is heterogeneously
dense, which may obscure small masses.
FINDINGS: There are no findings suspicious for malignancy. Images were
processed with CAD.
IMPRESSION: No mammographic evidence of malignancy. A result letter of this
screening mammogram will be mailed directly to the patient.

RECOMMENDATION:
Screening mammogram in one year. (Code:[id number])

BI-RADS CATEGORY  1: Negative.

## 2017-07-15 DIAGNOSIS — F419 Anxiety disorder, unspecified: Secondary | ICD-10-CM | POA: Diagnosis not present

## 2017-07-15 DIAGNOSIS — F331 Major depressive disorder, recurrent, moderate: Secondary | ICD-10-CM | POA: Diagnosis not present

## 2017-07-15 DIAGNOSIS — F1010 Alcohol abuse, uncomplicated: Secondary | ICD-10-CM | POA: Diagnosis not present

## 2017-07-28 ENCOUNTER — Telehealth: Payer: Self-pay | Admitting: Family Medicine

## 2017-07-28 NOTE — Telephone Encounter (Signed)
Just an FYI   Patient states that she went to see Dr Nicolasa Ducking and she put her on Celexa and Trazodone, told her to stop the [place] because it was raising her BP and that she would not see her again because she was an alcoholic and she does not see people with substance abuse.  She said she would have to go to [facility] for 30 days or see a substance abuse counselor before she would see her again.  She has an appointment with a [place] but her insurance does not cover.  I advised it was in her best interest to keep the appointment and shse said she would think about it.

## 2017-07-28 NOTE — Telephone Encounter (Signed)
Pt wants Jiles Garter to call her back to discuss her visit with Dr. Nicolasa Ducking and decide if she needs to come in to fu with dr Rosanna Randy  Her call back is [phone]  Thanks Con [place]

## 2017-08-12 ENCOUNTER — Other Ambulatory Visit: Payer: Self-pay

## 2017-08-12 MED ORDER — CITALOPRAM HYDROBROMIDE 20 MG PO TABS: 20.0000 mg | ORAL_TABLET | Freq: Every day | ORAL | 1 refills | Status: DC

## 2017-08-12 NOTE — Telephone Encounter (Signed)
Patient states that she needs refill on the Celexa.  She is not seeing Dr Nicolasa Ducking or the [facility] ([facility]-no ins coverage).  She is still on the Pristiq and states she has been checking her BP and it has been good.  She does not use the Trazodone at all.  She said she is doing so much better and thinks it was just the gloomy weather and long days that had her feeling so much worse.   Do you want to refill the Celexa? She only has 2 pills left.  Do you want to see her?

## 2017-08-12 NOTE — Telephone Encounter (Signed)
Pt called to follow up on message below. Pt stated that she has 2 pills of the Celexa before she is out of the medication.   Pt stated that she has been taking 20 mg of Celexa once a day. Please advise. Thanks TNP

## 2017-08-12 NOTE — Telephone Encounter (Signed)
Appointment made

## 2017-09-29 ENCOUNTER — Ambulatory Visit: Payer: Self-pay | Admitting: Family Medicine

## 2017-10-02 ENCOUNTER — Ambulatory Visit (INDEPENDENT_AMBULATORY_CARE_PROVIDER_SITE_OTHER): Payer: PPO | Admitting: Family Medicine

## 2017-10-02 ENCOUNTER — Encounter: Payer: Self-pay | Admitting: Family Medicine

## 2017-10-02 VITALS — BP 120/76 | HR 72 | Temp 98.6°F | Resp 14 | Wt 131.0 lb

## 2017-10-02 DIAGNOSIS — I10 Essential (primary) hypertension: Secondary | ICD-10-CM

## 2017-10-02 DIAGNOSIS — F411 Generalized anxiety disorder: Secondary | ICD-10-CM

## 2017-10-02 DIAGNOSIS — R739 Hyperglycemia, unspecified: Secondary | ICD-10-CM

## 2017-10-02 DIAGNOSIS — E7800 Pure hypercholesterolemia, unspecified: Secondary | ICD-10-CM

## 2017-10-02 DIAGNOSIS — F32A Depression, unspecified: Secondary | ICD-10-CM

## 2017-10-02 DIAGNOSIS — F329 Major depressive disorder, single episode, unspecified: Secondary | ICD-10-CM | POA: Diagnosis not present

## 2017-10-02 NOTE — Patient Instructions (Signed)
Take Celexa every other day until you run out.

## 2017-10-02 NOTE — Progress Notes (Signed)
Patient: Teresa Duarte Female    DOB: 12/16/1950   67 y.o.   MRN: [id number] Visit Date: 10/02/2017  Today's Provider: [name], MD   Chief Complaint  Patient presents with  . Depression  . Insomnia   Subjective:    HPI Pt is here today to discuss her depression and anxiety. She was referred to Dr. Nicolasa Ducking but she did not like her. She was put on Celexa and Trazodone but has not taken the trazodone. She is taking the Celexa but has not noticed much of a difference.  She has cut back on her alcohol but still drinks. She does have a 56yo son by her first marriage.     Allergies  Allergen Reactions  . Other Other (See Comments)    Anesthesia caused nausea and vomiting about 20 years ago     Current Outpatient Medications:  .  alendronate (FOSAMAX) 70 MG tablet, Take 1 tablet (70 mg total) by mouth every 7 (seven) days. Take with a full glass of water on an empty stomach., Disp: 4 tablet, Rfl: 11 .  b complex vitamins tablet, Take 1 tablet by mouth daily., Disp: , Rfl:  .  citalopram (CELEXA) 20 MG tablet, Take 1 tablet (20 mg total) by mouth daily., Disp: 30 tablet, Rfl: 1 .  desvenlafaxine (PRISTIQ) 50 MG 24 hr tablet, TAKE ONE TABLET BY MOUTH DAILY, Disp: 90 tablet, Rfl: 4 .  DILT-XR 180 MG 24 hr capsule, TAKE 2 CAPSULES BY MOUTH DAILY, Disp: 180 capsule, Rfl: 3 .  rosuvastatin (CRESTOR) 10 MG tablet, Take 1 tablet (10 mg total) by mouth daily., Disp: 90 tablet, Rfl: 3 .  valACYclovir (VALTREX) 500 MG tablet, TAKE 1 TABLET BY MOUTH DAILY, Disp: 90 tablet, Rfl: 3 .  chlordiazePOXIDE (LIBRIUM) 25 MG capsule, TAKE ONE CAPSULE EVERY SIX HOURS AS NEEDED (Patient not taking: Reported on 10/02/2017), Disp: 120 capsule, Rfl: 2  Review of Systems  Constitutional: Negative.   HENT: Negative.   Eyes: Negative.   Respiratory: Negative.   Cardiovascular: Negative.   Gastrointestinal: Negative.   Endocrine: Negative.   Genitourinary: Negative.   Musculoskeletal: Negative.     Skin: Negative.   Allergic/Immunologic: Negative.   Neurological: Negative.   Hematological: Negative.   Psychiatric/Behavioral: Negative.     Social History   Tobacco Use  . Smoking status: Never Smoker  . Smokeless tobacco: Never Used  Substance Use Topics  . Alcohol use: Yes    Alcohol/week: 0.0 oz    Comment: 0-3 glasses wine every night   Objective:   BP 120/76 (BP Location: Left Arm, Patient Position: Sitting, Cuff Size: Normal)   Pulse 72   Temp 98.6 F (37 C) (Oral)   Resp 14   Wt 131 lb (59.4 kg)   BMI 23.39 kg/m  Vitals:   10/02/17 1050  BP: 120/76  Pulse: 72  Resp: 14  Temp: 98.6 F (37 C)  TempSrc: Oral  Weight: 131 lb (59.4 kg)     Physical Exam  Constitutional: She is oriented to person, place, and time. She appears well-developed and well-nourished.  HENT:  Head: Normocephalic and atraumatic.  Right Ear: External ear normal.  Left Ear: External ear normal.  Nose: Nose normal.  Mouth/Throat: Oropharynx is clear and moist.  Eyes: Conjunctivae are normal.  Neck: No thyromegaly present.  Cardiovascular: Normal rate, regular rhythm and normal heart sounds.  Pulmonary/Chest: Effort normal and breath sounds normal.  Abdominal: Soft.  Neurological: She is  alert and oriented to person, place, and time.  Skin: Skin is warm and dry.  Psychiatric: She has a normal mood and affect. Her behavior is normal. Judgment and thought content normal.        Assessment & Plan:     1. Hypercholesteremia  - Lipid panel - Comprehensive metabolic panel  2. Essential (primary) hypertension  - CBC with Differential/Platelet - TSH  3. Hyperglycemia  - Hemoglobin A1c  4. Anxiety, generalized   5. Depression, unspecified depression type       I have done the exam and reviewed the above chart and it is accurate to the best of my knowledge. Development worker, community has been used in this note in any air is in the dictation or transcription are  unintentional.  [name], MD  [place]

## 2017-10-03 DIAGNOSIS — E7800 Pure hypercholesterolemia, unspecified: Secondary | ICD-10-CM | POA: Diagnosis not present

## 2017-10-03 DIAGNOSIS — I10 Essential (primary) hypertension: Secondary | ICD-10-CM | POA: Diagnosis not present

## 2017-10-03 DIAGNOSIS — R739 Hyperglycemia, unspecified: Secondary | ICD-10-CM | POA: Diagnosis not present

## 2017-10-04 LAB — CBC WITH DIFFERENTIAL/PLATELET
Basophils Absolute: 0 10*3/uL (ref 0.0–0.2)
Basos: 1 %
EOS (ABSOLUTE): 0 10*3/uL (ref 0.0–0.4)
Eos: 1 %
Hematocrit: 38.6 % (ref 34.0–46.6)
Hemoglobin: 13.5 g/dL (ref 11.1–15.9)
IMMATURE GRANS (ABS): 0 10*3/uL (ref 0.0–0.1)
Immature Granulocytes: 0 %
LYMPHS ABS: 1 10*3/uL (ref 0.7–3.1)
LYMPHS: 34 %
MCH: 32.7 pg (ref 26.6–33.0)
MCHC: 35 g/dL (ref 31.5–35.7)
MCV: 94 fL (ref 79–97)
MONOS ABS: 0.3 10*3/uL (ref 0.1–0.9)
Monocytes: 10 %
NEUTROS ABS: 1.7 10*3/uL (ref 1.4–7.0)
Neutrophils: 54 %
PLATELETS: 234 10*3/uL (ref 150–450)
RBC: 4.13 x10E6/uL (ref 3.77–5.28)
RDW: 12.7 % (ref 12.3–15.4)
WBC: 3.1 10*3/uL — ABNORMAL LOW (ref 3.4–10.8)

## 2017-10-04 LAB — COMPREHENSIVE METABOLIC PANEL
A/G RATIO: 2.2 (ref 1.2–2.2)
ALT: 10 IU/L (ref 0–32)
AST: 16 IU/L (ref 0–40)
Albumin: 4.7 g/dL (ref 3.6–4.8)
Alkaline Phosphatase: 40 IU/L (ref 39–117)
BUN/Creatinine Ratio: 16 (ref 12–28)
BUN: 12 mg/dL (ref 8–27)
Bilirubin Total: 0.5 mg/dL (ref 0.0–1.2)
CO2: 22 mmol/L (ref 20–29)
Calcium: 9.3 mg/dL (ref 8.7–10.3)
Chloride: 105 mmol/L (ref 96–106)
Creatinine, Ser: 0.76 mg/dL (ref 0.57–1.00)
GFR calc Af Amer: 95 mL/min/{1.73_m2} (ref >59)
GFR, EST NON AFRICAN AMERICAN: 82 mL/min/{1.73_m2} (ref >59)
GLUCOSE: 102 mg/dL — AB (ref 65–99)
Globulin, Total: 2.1 g/dL (ref 1.5–4.5)
POTASSIUM: 4.7 mmol/L (ref 3.5–5.2)
Sodium: 142 mmol/L (ref 134–144)
TOTAL PROTEIN: 6.8 g/dL (ref 6.0–8.5)

## 2017-10-04 LAB — LIPID PANEL
Chol/HDL Ratio: 3.2 ratio (ref 0.0–4.4)
Cholesterol, Total: 193 mg/dL (ref 100–199)
HDL: 61 mg/dL (ref >39)
LDL Calculated: 114 mg/dL — ABNORMAL HIGH (ref 0–99)
Triglycerides: 92 mg/dL (ref 0–149)
VLDL CHOLESTEROL CAL: 18 mg/dL (ref 5–40)

## 2017-10-04 LAB — HEMOGLOBIN A1C
ESTIMATED AVERAGE GLUCOSE: 100 mg/dL
Hgb A1c MFr Bld: 5.1 % (ref 4.8–5.6)

## 2017-10-04 LAB — TSH: TSH: 1.13 u[IU]/mL (ref 0.450–4.500)

## 2017-10-10 ENCOUNTER — Other Ambulatory Visit: Payer: Self-pay | Admitting: Family Medicine

## 2017-10-27 ENCOUNTER — Encounter: Payer: Self-pay | Admitting: Family Medicine

## 2017-11-02 ENCOUNTER — Encounter: Payer: Self-pay | Admitting: Family Medicine

## 2017-11-03 ENCOUNTER — Telehealth: Payer: Self-pay | Admitting: Family Medicine

## 2017-11-03 NOTE — Telephone Encounter (Signed)
Rupal wants to know a place where  Vivitrol injections are given.

## 2017-11-03 NOTE — Telephone Encounter (Signed)
I have sent her a message with the link to a side that can do a provider search.

## 2017-11-03 NOTE — Telephone Encounter (Signed)
Do you know?

## 2017-11-03 NOTE — Telephone Encounter (Signed)
Please advise. Thanks.  

## 2017-11-04 ENCOUNTER — Other Ambulatory Visit: Payer: Self-pay | Admitting: Family Medicine

## 2017-11-04 DIAGNOSIS — M810 Age-related osteoporosis without current pathological fracture: Secondary | ICD-10-CM

## 2017-11-05 ENCOUNTER — Ambulatory Visit: Payer: Self-pay | Admitting: Family Medicine

## 2017-11-06 DIAGNOSIS — D225 Melanocytic nevi of trunk: Secondary | ICD-10-CM | POA: Diagnosis not present

## 2017-11-06 DIAGNOSIS — L918 Other hypertrophic disorders of the skin: Secondary | ICD-10-CM | POA: Diagnosis not present

## 2017-11-06 DIAGNOSIS — L821 Other seborrheic keratosis: Secondary | ICD-10-CM | POA: Diagnosis not present

## 2017-11-06 DIAGNOSIS — L578 Other skin changes due to chronic exposure to nonionizing radiation: Secondary | ICD-10-CM | POA: Diagnosis not present

## 2017-11-06 DIAGNOSIS — L723 Sebaceous cyst: Secondary | ICD-10-CM | POA: Diagnosis not present

## 2017-11-06 DIAGNOSIS — D1801 Hemangioma of skin and subcutaneous tissue: Secondary | ICD-10-CM | POA: Diagnosis not present

## 2017-11-06 DIAGNOSIS — L720 Epidermal cyst: Secondary | ICD-10-CM | POA: Diagnosis not present

## 2017-11-06 DIAGNOSIS — L812 Freckles: Secondary | ICD-10-CM | POA: Diagnosis not present

## 2017-11-06 DIAGNOSIS — L680 Hirsutism: Secondary | ICD-10-CM | POA: Diagnosis not present

## 2017-11-07 ENCOUNTER — Telehealth: Payer: Self-pay | Admitting: Family Medicine

## 2017-11-07 NOTE — Telephone Encounter (Signed)
Sending back a form to be completed for Teresa Duarte to sell her life ins. policy

## 2017-11-11 NOTE — Telephone Encounter (Signed)
Done

## 2017-12-03 DIAGNOSIS — Z01419 Encounter for gynecological examination (general) (routine) without abnormal findings: Secondary | ICD-10-CM | POA: Diagnosis not present

## 2017-12-03 DIAGNOSIS — Z124 Encounter for screening for malignant neoplasm of cervix: Secondary | ICD-10-CM | POA: Diagnosis not present

## 2017-12-03 DIAGNOSIS — Z1231 Encounter for screening mammogram for malignant neoplasm of breast: Secondary | ICD-10-CM | POA: Diagnosis not present

## 2017-12-04 ENCOUNTER — Other Ambulatory Visit: Payer: Self-pay | Admitting: Obstetrics and Gynecology

## 2017-12-04 DIAGNOSIS — Z1231 Encounter for screening mammogram for malignant neoplasm of breast: Secondary | ICD-10-CM

## 2017-12-24 ENCOUNTER — Ambulatory Visit
Admission: RE | Admit: 2017-12-24 | Discharge: 2017-12-24 | Disposition: A | Discharge status: Home / Self Care | Payer: PPO | Source: Ambulatory Visit | Attending: Obstetrics and Gynecology | Admitting: Obstetrics and Gynecology

## 2017-12-24 DIAGNOSIS — Z1231 Encounter for screening mammogram for malignant neoplasm of breast: Secondary | ICD-10-CM | POA: Diagnosis not present

## 2018-01-15 ENCOUNTER — Other Ambulatory Visit: Payer: Self-pay | Admitting: Family Medicine

## 2018-04-08 ENCOUNTER — Ambulatory Visit (INDEPENDENT_AMBULATORY_CARE_PROVIDER_SITE_OTHER): Payer: PPO | Admitting: Family Medicine

## 2018-04-08 ENCOUNTER — Ambulatory Visit (INDEPENDENT_AMBULATORY_CARE_PROVIDER_SITE_OTHER): Payer: PPO

## 2018-04-08 VITALS — BP 132/70 | HR 78 | Temp 98.8°F | Resp 16 | Ht 63.0 in | Wt 135.0 lb

## 2018-04-08 VITALS — BP 132/70 | HR 78 | Temp 98.8°F | Ht 63.0 in | Wt 135.6 lb

## 2018-04-08 DIAGNOSIS — I10 Essential (primary) hypertension: Secondary | ICD-10-CM | POA: Diagnosis not present

## 2018-04-08 DIAGNOSIS — R002 Palpitations: Secondary | ICD-10-CM

## 2018-04-08 DIAGNOSIS — Z0000 Encounter for general adult medical examination without abnormal findings: Secondary | ICD-10-CM | POA: Diagnosis not present

## 2018-04-08 DIAGNOSIS — Z23 Encounter for immunization: Secondary | ICD-10-CM | POA: Diagnosis not present

## 2018-04-08 DIAGNOSIS — F3341 Major depressive disorder, recurrent, in partial remission: Secondary | ICD-10-CM

## 2018-04-08 DIAGNOSIS — M810 Age-related osteoporosis without current pathological fracture: Secondary | ICD-10-CM | POA: Diagnosis not present

## 2018-04-08 DIAGNOSIS — K219 Gastro-esophageal reflux disease without esophagitis: Secondary | ICD-10-CM

## 2018-04-08 DIAGNOSIS — R079 Chest pain, unspecified: Secondary | ICD-10-CM

## 2018-04-08 DIAGNOSIS — F1020 Alcohol dependence, uncomplicated: Secondary | ICD-10-CM | POA: Diagnosis not present

## 2018-04-08 DIAGNOSIS — F411 Generalized anxiety disorder: Secondary | ICD-10-CM

## 2018-04-08 NOTE — Progress Notes (Signed)
Subjective:   [name] is a 67 y.o. female who presents for an Initial Medicare Annual Wellness Visit.  Review of Systems    N/A  Cardiac Risk Factors include: advanced age (>66men, >42 women);dyslipidemia;hypertension     Objective:    Today's Vitals   04/08/18 0934  BP: 132/70  Pulse: 78  Temp: 98.8 F (37.1 C)  TempSrc: Oral  Weight: 135 lb 9.6 oz (61.5 kg)  Height: 5\' 3"  (1.6 m)  PainSc: 0-No pain   Body mass index is 24.02 kg/m.  Advanced Directives 04/08/2018 02/07/2016 02/22/2014  Does Patient Have a Medical Advance Directive? No No No  Would patient like information on creating a medical advance directive? Yes (MAU/Ambulatory/Procedural Areas - Information given) No - patient declined information No - patient declined information    Current Medications (verified) Outpatient Encounter Medications as of 04/08/2018  Medication Sig  . alendronate (FOSAMAX) 70 MG tablet TAKE 1 TABLET ONCE A WEEK  . b complex vitamins tablet Take 1 tablet by mouth daily.  . Black Elderberry 50 MG/5ML SYRP Take by mouth daily.  . citalopram (CELEXA) 20 MG tablet TAKE ONE TABLET EVERY DAY  . desvenlafaxine (PRISTIQ) 50 MG 24 hr tablet TAKE ONE TABLET BY MOUTH DAILY  . DILT-XR 180 MG 24 hr capsule TAKE 2 CAPSULES EVERY DAY  . rosuvastatin (CRESTOR) 10 MG tablet Take 1 tablet (10 mg total) by mouth daily.  . valACYclovir (VALTREX) 500 MG tablet TAKE 1 TABLET BY MOUTH DAILY (Patient taking differently: No sig reported)  . chlordiazePOXIDE (LIBRIUM) 25 MG capsule TAKE ONE CAPSULE EVERY SIX HOURS AS NEEDED (Patient not taking: Reported on 10/02/2017)   No facility-administered encounter medications on file as of 04/08/2018.     Allergies (verified) Other   History: Past Medical History:  Diagnosis Date  . Anxiety   . Arthritis   . Chronic kidney disease    ?spot on her left kidney  . Depression   . HBP (high blood pressure)   . Heart murmur    doesn't give her any problems    . High cholesterol   . PONV (postoperative nausea and vomiting)    Past Surgical History:  Procedure Laterality Date  . ABDOMINAL HYSTERECTOMY    . CHOLECYSTECTOMY N/A 03/03/2014   Procedure: LAPAROSCOPIC CHOLECYSTECTOMY WITH INTRAOPERATIVE CHOLANGIOGRAM;  Surgeon: Coralie Keens, MD;  Location: [facility];  Service: General;  Laterality: N/A;  . COLONOSCOPY  2013  . FOOT SURGERY Bilateral   . SHOULDER SURGERY Right   . TENDON REPAIR Left    tendon/ligament repair, pt not sure which  . TONSILLECTOMY     Family History  Problem Relation Age of Onset  . Hypertension Mother   . Hypercholesterolemia Mother   . Heart attack Father   . Depression Sister   . Depression Sister   . Hypertension Sister   . Hypercholesterolemia Sister   . Depression Son   . Anxiety disorder Son   . Breast cancer Neg Hx    Social History   Socioeconomic History  . Marital status: Married    Spouse name: Not on file  . Number of children: 2  . Years of education: Not on file  . Highest education level: High school graduate  Occupational History  . Occupation: retired  Scientific laboratory technician  . Financial resource strain: Not hard at all  . Food insecurity:    Worry: Never true    Inability: Never true  . Transportation needs:    Medical: No  Non-medical: No  Tobacco Use  . Smoking status: Never Smoker  . Smokeless tobacco: Never Used  Substance and Sexual Activity  . Alcohol use: Yes    Alcohol/week: 21.0 standard drinks    Types: 21 Glasses of wine per week    Comment: 3 glasses of wine nightly  . Drug use: No  . Sexual activity: Yes    Partners: Male    Comment: married, husband has a hx of liver failurem alcohol and drug use with Hep C  Lifestyle  . Physical activity:    Days per week: 0 days    Minutes per session: 0 min  . Stress: Very much  Relationships  . Social connections:    Talks on phone: Patient refused    Gets together: Patient refused    Attends religious service: Patient  refused    Active member of club or organization: Patient refused    Attends meetings of clubs or organizations: Patient refused    Relationship status: Patient refused  Other Topics Concern  . Not on file  Social History Narrative  . Not on file    Tobacco Counseling Counseling given: Not Answered   Clinical Intake:  Pre-visit preparation completed: Yes  Pain : No/denies pain Pain Score: 0-No pain     Nutritional Status: BMI of 19-24  Normal Nutritional Risks: None Diabetes: No  How often do you need to have someone help you when you read instructions, pamphlets, or other written materials from your doctor or pharmacy?: 1 - Never  Interpreter Needed?: No  Information entered by :: [facility], LPN   Activities of Daily Living In your present state of health, do you have any difficulty performing the following activities: 04/08/2018 10/02/2017  Hearing? Y N  Comment Does not wear hearing aids.  -  Vision? Y N  Comment Wears eye glasses.  -  Difficulty concentrating or making decisions? Y N  Walking or climbing stairs? N N  Dressing or bathing? N N  Doing errands, shopping? N N  Preparing Food and eating ? N -  Using the Toilet? N -  In the past six months, have you accidently leaked urine? N -  Do you have problems with loss of bowel control? N -  Managing your Medications? N -  Managing your Finances? N -  Housekeeping or managing your Housekeeping? N -  Some recent data might be hidden     Immunizations and Health Maintenance Immunization History  Administered Date(s) Administered  . Influenza, High Dose Seasonal PF 02/07/2016  . Influenza,inj,Quad PF,6+ Mos 02/24/2013, 02/17/2014  . Influenza-Unspecified 03/09/2018  . Pneumococcal Conjugate-13 02/07/2016  . Pneumococcal Polysaccharide-23 04/08/2018   Health Maintenance Due  Topic Date Due  . TETANUS/TDAP  11/03/1969    Patient Care Team: Jerrol Banana., MD as PCP - General (Family  Medicine) Benjaman Kindler, MD as Consulting Physician (Obstetrics and Gynecology) Estill Cotta, MD (Ophthalmology) Richmond Campbell, MD as Consulting Physician (Gastroenterology)  Indicate any recent Medical Services you may have received from other than [facility] providers in the past year (date may be approximate).     Assessment:   This is a routine wellness examination for Teresa Duarte.  Hearing/Vision screen No exam data present  Dietary issues and exercise activities discussed: Current Exercise Habits: Home exercise routine, Type of exercise: walking(walks 10,000 steps daily (all throughout the day).), Intensity: Mild, Exercise limited by: None identified  Goals    . Reduce alcohol intake     Recommend cutting back  on alcohol consumption to 1 glass of a wine a day.       Depression Screen PHQ 2/9 Scores 04/08/2018 05/08/2017 02/07/2016  PHQ - 2 Score 0 2 6  PHQ- 9 Score - 4 11    Fall Risk Fall Risk  04/08/2018 05/08/2017 02/07/2016  Falls in the past year? 0 No No    FALL RISK PREVENTION PERTAINING TO THE HOME:  Any stairs in or around the home WITH handrails? No  Home free of loose throw rugs in walkways, pet beds, electrical cords, etc? Yes  Adequate lighting in your home to reduce risk of falls? Yes   ASSISTIVE DEVICES UTILIZED TO PREVENT FALLS:  Life alert? No  Use of a cane, walker or w/c? No  Grab bars in the bathroom? Yes  Shower chair or bench in shower? No  Elevated toilet seat or a handicapped toilet? No    TIMED UP AND GO:  Was the test performed? No .     Cognitive Function: Declined today.         Screening Tests Health Maintenance  Topic Date Due  . TETANUS/TDAP  11/03/1969  . DEXA SCAN  11/29/2018  . MAMMOGRAM  12/25/2019  . COLONOSCOPY  12/05/2020  . INFLUENZA VACCINE  Completed  . Hepatitis C Screening  Completed  . PNA vac Low Risk Adult  Completed    Qualifies for Shingles Vaccine? Yes . Due for Shingrix. Education has been  provided regarding the importance of this vaccine. Pt has been advised to call insurance company to determine out of pocket expense. Advised may also receive vaccine at local pharmacy or Health Dept. Verbalized acceptance and understanding.  Tdap: Although this vaccine is not a covered service during a Wellness Exam, does the patient still wish to receive this vaccine today?  No .  Education has been provided regarding the importance of this vaccine. Advised may receive this vaccine at local pharmacy or Health Dept. Aware to provide a copy of the vaccination record if obtained from local pharmacy or Health Dept. Verbalized acceptance and understanding.  Flu Vaccine: Up to date  Pneumococcal Vaccine: Up to date   Cancer Screenings:  Colorectal Screening: Completed 12/06/10. Repeat every 10 years.  Mammogram: Completed 12/24/17.   Bone Density: Completed 01/10/17. Results reflect OSTEOPOROSIS. Repeat every 2 years.   Lung Cancer Screening: (Low Dose CT Chest recommended if Age 88-80 years, 30 pack-year currently smoking OR have quit w/in 15years.) does not qualify.    Additional Screening:  Hepatitis C Screening: Up to date  Vision Screening: Recommended annual ophthalmology exams for early detection of glaucoma and other disorders of the eye.  Dental Screening: Recommended annual dental exams for proper oral hygiene  Community Resource Referral:  CRR required this visit?  No       Plan:  I have personally reviewed and addressed the Medicare Annual Wellness questionnaire and have noted the following in the patient's chart:  A. Medical and social history B. Use of alcohol, tobacco or illicit drugs  C. Current medications and supplements D. Functional ability and status E.  Nutritional status F.  Physical activity G. Advance directives H. List of other physicians I.  Hospitalizations, surgeries, and ER visits in previous 12 months J.  [place] such as hearing  and vision if needed, cognitive and depression L. Referrals and appointments - none  In addition, I have reviewed and discussed with patient certain preventive protocols, quality metrics, and best practice recommendations. A written personalized  care plan for preventive services as well as general preventive health recommendations were provided to patient.  See attached scanned questionnaire for additional information.   Signed,  Fabio Neighbors, LPN Nurse Health Advisor   Nurse Recommendations: Pt declined the tetanus vaccine today.

## 2018-04-08 NOTE — Patient Instructions (Addendum)
Teresa Duarte , Thank you for taking time to come for your Medicare Wellness Visit. I appreciate your ongoing commitment to your health goals. Please review the following plan we discussed and let me know if I can assist you in the future.   Screening recommendations/referrals: Colonoscopy: Up to date, due 11/2020 Mammogram: Up to date, due 11/2019 Bone Density: Up to date, due 12/2018 Recommended yearly ophthalmology/optometry visit for glaucoma screening and checkup Recommended yearly dental visit for hygiene and checkup  Vaccinations: Influenza vaccine: Up to date Pneumococcal vaccine: Completed series today. Tdap vaccine: Pt declines today.  Shingles vaccine: Pt declines today.     Advanced directives: Advance directive discussed with you today. I have provided a copy for you to complete at home and have notarized. Once this is complete please bring a copy in to our office so we can scan it into your chart.  Conditions/risks identified: Recommend cutting back on alcohol consumption to 1 glass of a wine a day.   Next appointment: 10:20 AM today with Dr Rosanna Randy.    Preventive Care 31 Years and Older, Female Preventive care refers to lifestyle choices and visits with your health care provider that can promote health and wellness. What does preventive care include?  A yearly physical exam. This is also called an annual well check.  Dental exams once or twice a year.  Routine eye exams. Ask your health care provider how often you should have your eyes checked.  Personal lifestyle choices, including:  Daily care of your teeth and gums.  Regular physical activity.  Eating a healthy diet.  Avoiding tobacco and drug use.  Limiting alcohol use.  Practicing safe sex.  Taking low-dose aspirin every day.  Taking vitamin and mineral supplements as recommended by your health care provider. What happens during an annual well check? The services and screenings done by your health  care provider during your annual well check will depend on your age, overall health, lifestyle risk factors, and family history of disease. Counseling  Your health care provider may ask you questions about your:  Alcohol use.  Tobacco use.  Drug use.  Emotional well-being.  Home and relationship well-being.  Sexual activity.  Eating habits.  History of falls.  Memory and ability to understand (cognition).  Work and work Statistician.  Reproductive health. Screening  You may have the following tests or measurements:  Height, weight, and BMI.  Blood pressure.  Lipid and cholesterol levels. These may be checked every 5 years, or more frequently if you are over 54 years old.  Skin check.  Lung cancer screening. You may have this screening every year starting at age 41 if you have a 30-pack-year history of smoking and currently smoke or have quit within the past 15 years.  Fecal occult blood test (FOBT) of the stool. You may have this test every year starting at age 29.  Flexible sigmoidoscopy or colonoscopy. You may have a sigmoidoscopy every 5 years or a colonoscopy every 10 years starting at age 4.  Hepatitis C blood test.  Hepatitis B blood test.  Sexually transmitted disease (STD) testing.  Diabetes screening. This is done by checking your blood sugar (glucose) after you have not eaten for a while (fasting). You may have this done every 1-3 years.  Bone density scan. This is done to screen for osteoporosis. You may have this done starting at age 90.  Mammogram. This may be done every 1-2 years. Talk to your health care provider about how  often you should have regular mammograms. Talk with your health care provider about your test results, treatment options, and if necessary, the need for more tests. Vaccines  Your health care provider may recommend certain vaccines, such as:  Influenza vaccine. This is recommended every year.  Tetanus, diphtheria, and  acellular pertussis (Tdap, Td) vaccine. You may need a Td booster every 10 years.  Zoster vaccine. You may need this after age 3.  Pneumococcal 13-valent conjugate (PCV13) vaccine. One dose is recommended after age 40.  Pneumococcal polysaccharide (PPSV23) vaccine. One dose is recommended after age 54. Talk to your health care provider about which screenings and vaccines you need and how often you need them. This information is not intended to replace advice given to you by your health care provider. Make sure you discuss any questions you have with your health care provider. Document Released: 05/12/2015 Document Revised: 01/03/2016 Document Reviewed: 02/14/2015 Elsevier Interactive Patient Education  2017 [place] Prevention in the Home Falls can cause injuries. They can happen to people of all ages. There are many things you can do to make your home safe and to help prevent falls. What can I do on the outside of my home?  Regularly fix the edges of walkways and driveways and fix any cracks.  Remove anything that might make you trip as you walk through a door, such as a raised step or threshold.  Trim any bushes or trees on the path to your home.  Use bright outdoor lighting.  Clear any walking paths of anything that might make someone trip, such as rocks or tools.  Regularly check to see if handrails are loose or broken. Make sure that both sides of any steps have handrails.  Any raised decks and porches should have guardrails on the edges.  Have any leaves, snow, or ice cleared regularly.  Use sand or salt on walking paths during winter.  Clean up any spills in your garage right away. This includes oil or grease spills. What can I do in the bathroom?  Use night lights.  Install grab bars by the toilet and in the tub and shower. Do not use towel bars as grab bars.  Use non-skid mats or decals in the tub or shower.  If you need to sit down in the shower, use  a plastic, non-slip stool.  Keep the floor dry. Clean up any water that spills on the floor as soon as it happens.  Remove soap buildup in the tub or shower regularly.  Attach bath mats securely with double-sided non-slip rug tape.  Do not have throw rugs and other things on the floor that can make you trip. What can I do in the bedroom?  Use night lights.  Make sure that you have a light by your bed that is easy to reach.  Do not use any sheets or blankets that are too big for your bed. They should not hang down onto the floor.  Have a firm chair that has side arms. You can use this for support while you get dressed.  Do not have throw rugs and other things on the floor that can make you trip. What can I do in the kitchen?  Clean up any spills right away.  Avoid walking on wet floors.  Keep items that you use a lot in easy-to-reach places.  If you need to reach something above you, use a strong step stool that has a grab bar.  Keep electrical  cords out of the way.  Do not use floor polish or wax that makes floors slippery. If you must use wax, use non-skid floor wax.  Do not have throw rugs and other things on the floor that can make you trip. What can I do with my stairs?  Do not leave any items on the stairs.  Make sure that there are handrails on both sides of the stairs and use them. Fix handrails that are broken or loose. Make sure that handrails are as long as the stairways.  Check any carpeting to make sure that it is firmly attached to the stairs. Fix any carpet that is loose or worn.  Avoid having throw rugs at the top or bottom of the stairs. If you do have throw rugs, attach them to the floor with carpet tape.  Make sure that you have a light switch at the top of the stairs and the bottom of the stairs. If you do not have them, ask someone to add them for you. What else can I do to help prevent falls?  Wear shoes that:  Do not have high heels.  Have  rubber bottoms.  Are comfortable and fit you well.  Are closed at the toe. Do not wear sandals.  If you use a stepladder:  Make sure that it is fully opened. Do not climb a closed stepladder.  Make sure that both sides of the stepladder are locked into place.  Ask someone to hold it for you, if possible.  Clearly mark and make sure that you can see:  Any grab bars or handrails.  First and last steps.  Where the edge of each step is.  Use tools that help you move around (mobility aids) if they are needed. These include:  Canes.  Walkers.  Scooters.  Crutches.  Turn on the lights when you go into a dark area. Replace any light bulbs as soon as they burn out.  Set up your furniture so you have a clear path. Avoid moving your furniture around.  If any of your floors are uneven, fix them.  If there are any pets around you, be aware of where they are.  Review your medicines with your doctor. Some medicines can make you feel dizzy. This can increase your chance of falling. Ask your doctor what other things that you can do to help prevent falls. This information is not intended to replace advice given to you by your health care provider. Make sure you discuss any questions you have with your health care provider. Document Released: 02/09/2009 Document Revised: 09/21/2015 Document Reviewed: 05/20/2014 Elsevier Interactive Patient Education  2017 [employer].

## 2018-04-08 NOTE — Progress Notes (Signed)
Patient: Teresa Duarte, Female    DOB: [birth year], 67 y.o.   MRN: [id number] Visit Date: 04/08/2018  Today's Provider: [name], MD   Chief Complaint  Patient presents with  . Annual Exam   Subjective:  Teresa Duarte is a 67 y.o. female who presents today for health maintenance and complete physical. She feels fairly well. She reports exercising daily. She reports she is sleeping poorly.  Immunization History  Administered Date(s) Administered  . Influenza, High Dose Seasonal PF 02/07/2016  . Influenza,inj,Quad PF,6+ Mos 02/24/2013, 02/17/2014  . Influenza-Unspecified 03/09/2018  . Pneumococcal Conjugate-13 02/07/2016  . Pneumococcal Polysaccharide-23 04/08/2018   12/06/10 Colonoscopy-Diverticulosis, int hemorrhoids, repeat in 10 years. 12/24/17 Mammogram-Negative 12/27/16 DEXA Scan-Osteoporosis 11/11/14 Pap- Atypical Squamous Cells-Undetermined Significance, + ASC-US, Negative HPV. 12/03/17 Pap Negative  Review of Systems  Constitutional: Negative.   HENT: Positive for hearing loss and tinnitus.   Eyes: Negative.   Respiratory: Positive for chest tightness.   Cardiovascular: Positive for palpitations.  Gastrointestinal: Negative.   Endocrine: Negative.   Genitourinary: Negative.   Musculoskeletal: Negative.   Skin: Negative.   Allergic/Immunologic: Negative.   Neurological: Positive for dizziness and light-headedness.  Hematological: Negative.   Psychiatric/Behavioral: Positive for sleep disturbance. The patient is nervous/anxious.     Social History   Socioeconomic History  . Marital status: Married    Spouse name: Not on file  . Number of children: 2  . Years of education: Not on file  . Highest education level: High school graduate  Occupational History  . Occupation: retired  Scientific laboratory technician  . Financial resource strain: Not hard at all  . Food insecurity:    Worry: Never true    Inability: Never true  . Transportation needs:    Medical: No    Non-medical:  No  Tobacco Use  . Smoking status: Never Smoker  . Smokeless tobacco: Never Used  Substance and Sexual Activity  . Alcohol use: Yes    Alcohol/week: 21.0 standard drinks    Types: 21 Glasses of wine per week    Comment: 3 glasses of wine nightly  . Drug use: No  . Sexual activity: Yes    Partners: Male    Comment: married, husband has a hx of liver failurem alcohol and drug use with Hep C  Lifestyle  . Physical activity:    Days per week: 0 days    Minutes per session: 0 min  . Stress: Very much  Relationships  . Social connections:    Talks on phone: Patient refused    Gets together: Patient refused    Attends religious service: Patient refused    Active member of club or organization: Patient refused    Attends meetings of clubs or organizations: Patient refused    Relationship status: Patient refused  . Intimate partner violence:    Fear of current or ex partner: Patient refused    Emotionally abused: Patient refused    Physically abused: Patient refused    Forced sexual activity: Patient refused  Other Topics Concern  . Not on file  Social History Narrative  . Not on file    Patient Active Problem List   Diagnosis Date Noted  . Hyperglycemia 05/08/2016  . Initial Medicare annual wellness visit 02/07/2016  . Stress at home 02/07/2016  . Alcoholic ([place]) 86/76/1950  . Abdominal pain, right upper quadrant 10/12/2014  . Anxiety 10/12/2014  . 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 Essential (primary) hypertension 10/12/2014  . Clinical depression 10/12/2014  .  Anxiety, generalized 10/12/2014  . Hypercholesteremia 10/12/2014  . Cannot sleep 10/12/2014  . Kidney lump 10/12/2014  . Lipoma of neck 10/12/2014  . Affective disorder, major 10/12/2014  . Awareness of heartbeats 10/12/2014  . Pancreatitis 10/12/2014  . Lump in neck 10/12/2014  . Deafness, sensorineural 10/12/2014  . Abnormal weight loss 10/12/2014  . Moderate vaginal dysplasia, histologically confirmed  07/29/2014  . Neck mass 09/23/2013    Past Surgical History:  Procedure Laterality Date  . ABDOMINAL HYSTERECTOMY    . CHOLECYSTECTOMY N/A 03/03/2014   Procedure: LAPAROSCOPIC CHOLECYSTECTOMY WITH INTRAOPERATIVE CHOLANGIOGRAM;  Surgeon: Coralie Keens, MD;  Location: [facility];  Service: General;  Laterality: N/A;  . COLONOSCOPY  2013  . FOOT SURGERY Bilateral   . SHOULDER SURGERY Right   . TENDON REPAIR Left    tendon/ligament repair, pt not sure which  . TONSILLECTOMY      Her family history includes Anxiety disorder in her son; Depression in her sister, sister, and son; Heart attack in her father; Hypercholesterolemia in her mother and sister; Hypertension in her mother and sister.     Outpatient Encounter Medications as of 04/08/2018  Medication Sig  . alendronate (FOSAMAX) 70 MG tablet TAKE 1 TABLET ONCE A WEEK  . b complex vitamins tablet Take 1 tablet by mouth daily.  . Black Elderberry 50 MG/5ML SYRP Take by mouth daily.  . citalopram (CELEXA) 20 MG tablet TAKE ONE TABLET EVERY DAY  . desvenlafaxine (PRISTIQ) 50 MG 24 hr tablet TAKE ONE TABLET BY MOUTH DAILY  . DILT-XR 180 MG 24 hr capsule TAKE 2 CAPSULES EVERY DAY  . rosuvastatin (CRESTOR) 10 MG tablet Take 1 tablet (10 mg total) by mouth daily.  . valACYclovir (VALTREX) 500 MG tablet TAKE 1 TABLET BY MOUTH DAILY (Patient taking differently: No sig reported)  . chlordiazePOXIDE (LIBRIUM) 25 MG capsule TAKE ONE CAPSULE EVERY SIX HOURS AS NEEDED (Patient not taking: Reported on 10/02/2017)   No facility-administered encounter medications on file as of 04/08/2018.     Patient Care Team: Jerrol Banana., MD as PCP - General (Family Medicine) Benjaman Kindler, MD as Consulting Physician (Obstetrics and Gynecology) Estill Cotta, MD (Ophthalmology) Richmond Campbell, MD as Consulting Physician (Gastroenterology)      Objective:   Vitals:  Vitals:   04/08/18 1010  BP: 132/70  Pulse: 78  Resp: 16  Temp: 98.8 F  (37.1 C)  TempSrc: Oral  Weight: 135 lb (61.2 kg)  Height: 5\' 3"  (1.6 m)    Physical Exam  Constitutional: She is oriented to person, place, and time. She appears well-developed and well-nourished.  HENT:  Head: Normocephalic and atraumatic.  Right Ear: External ear normal.  Left Ear: External ear normal.  Nose: Nose normal.  Mouth/Throat: Oropharynx is clear and moist.  Eyes: Pupils are equal, round, and reactive to light. Conjunctivae and EOM are normal.  Neck: Normal range of motion. Neck supple.  Cardiovascular: Normal rate, regular rhythm, normal heart sounds and intact distal pulses.  Pulmonary/Chest: Effort normal and breath sounds normal.  Abdominal: Soft. Bowel sounds are normal.  Musculoskeletal: Normal range of motion.  Neurological: She is alert and oriented to person, place, and time.  Skin: Skin is warm and dry.  Psychiatric: She has a normal mood and affect. Her behavior is normal. Judgment and thought content normal.     Depression Screen PHQ 2/9 Scores 04/08/2018 05/08/2017 02/07/2016  PHQ - 2 Score 0 2 6  PHQ- 9 Score - 4 11  Assessment & Plan:     Routine Health Maintenance and Physical Exam  Exercise Activities and Dietary recommendations Goals    . Reduce alcohol intake     Recommend cutting back on alcohol consumption to 1 glass of a wine a day.        Immunization History  Administered Date(s) Administered  . Influenza, High Dose Seasonal PF 02/07/2016  . Influenza,inj,Quad PF,6+ Mos 02/24/2013, 02/17/2014  . Influenza-Unspecified 03/09/2018  . Pneumococcal Conjugate-13 02/07/2016  . Pneumococcal Polysaccharide-23 04/08/2018    Health Maintenance  Topic Date Due  . TETANUS/TDAP  11/03/1969  . DEXA SCAN  11/29/2018  . MAMMOGRAM  12/25/2019  . COLONOSCOPY  12/05/2020  . INFLUENZA VACCINE  Completed  . Hepatitis C Screening  Completed  . PNA vac Low Risk Adult  Completed     Discussed health benefits of physical activity, and  encouraged her to engage in regular exercise appropriate for her age and condition.  1. Encounter for annual physical examination excluding gynecological examination in a patient older than 17 years   2. Chest pain, unspecified type Most likely GERD or anxiety.  Noncardiac. - EKG 12-Lead  3. Gastroesophageal reflux disease without esophagitis   4. Intermittent palpitations   5. Essential (primary) hypertension   6. Alcoholic ([place]) Plan patient will abstain  7. Anxiety, generalized Going issue due to family problems  8. Recurrent major depressive disorder, in partial remission ([place]) Major family problems contribute to this  9. Age-related osteoporosis without current pathological fracture Alendronate and then stop but she has been on this for about 5 years.  BMD next year   I have done the exam and reviewed the chart and it is accurate to the best of my knowledge. Development worker, community has been used and  any errors in dictation or transcription are unintentional. Miguel Aschoff M.D. [facility]

## 2018-04-30 DIAGNOSIS — R197 Diarrhea, unspecified: Secondary | ICD-10-CM | POA: Diagnosis not present

## 2018-05-01 DIAGNOSIS — R197 Diarrhea, unspecified: Secondary | ICD-10-CM | POA: Diagnosis not present

## 2018-07-09 ENCOUNTER — Other Ambulatory Visit: Payer: Self-pay | Admitting: Family Medicine

## 2018-07-09 ENCOUNTER — Telehealth: Payer: Self-pay

## 2018-07-09 NOTE — Telephone Encounter (Signed)
-----   Message from Jerrol Banana., MD sent at 07/09/2018  1:38 PM EDT ----- normal

## 2018-07-09 NOTE — Telephone Encounter (Signed)
Pt advised.   Thanks,   -Teresa Duarte  

## 2018-07-30 ENCOUNTER — Telehealth: Payer: Self-pay

## 2018-07-30 NOTE — Telephone Encounter (Signed)
Mr. Teresa Duarte had called office to inquire about medical records that were sent over on patient? He would like for someone to call him back from medical records to confirm today.

## 2018-07-31 NOTE — Telephone Encounter (Signed)
Please review

## 2018-08-06 ENCOUNTER — Other Ambulatory Visit: Payer: Self-pay | Admitting: Family Medicine

## 2018-08-06 DIAGNOSIS — E7800 Pure hypercholesterolemia, unspecified: Secondary | ICD-10-CM

## 2018-08-18 NOTE — Telephone Encounter (Signed)
Mr.Teresa Duarte calling to follow-up on the status on the medical records that he requested 2 weeks ago? He is requesting a call back from the medical record personal to let him know. Please review.\  Thanks, -Joseline

## 2018-08-19 NOTE — Telephone Encounter (Signed)
Sharyn Dross! Did you talk to this guy yet?

## 2018-09-10 NOTE — Telephone Encounter (Signed)
Spoke with Mr. Applebaum again. He was advised that [employer] was given the request on 08/10/2018 and again gave him the number to [employer]. I also spoke with our [employer] contact Irvin, and he stated that he doesn't understand why the request isn't in the system on [employer]'s part. Laurena Bering stated that he will contact pt and then follow up with Mr. Applebaum. Thanks TNP

## 2018-10-05 ENCOUNTER — Telehealth: Payer: Self-pay | Admitting: Family Medicine

## 2018-10-05 NOTE — Telephone Encounter (Signed)
Please review last office visit 04/08/18

## 2018-10-05 NOTE — Telephone Encounter (Signed)
Pt needs a refill on her Citalopram 20 mg  [facility]  [place]

## 2018-10-05 NOTE — Telephone Encounter (Signed)
[place] for 5 rf.

## 2018-10-06 ENCOUNTER — Other Ambulatory Visit: Payer: Self-pay

## 2018-10-06 MED ORDER — CITALOPRAM HYDROBROMIDE 20 MG PO TABS: 20.0000 mg | ORAL_TABLET | Freq: Every day | ORAL | 5 refills | Status: DC

## 2018-10-13 ENCOUNTER — Ambulatory Visit: Payer: PPO | Admitting: Family Medicine

## 2019-03-08 DIAGNOSIS — L812 Freckles: Secondary | ICD-10-CM | POA: Diagnosis not present

## 2019-03-08 DIAGNOSIS — D225 Melanocytic nevi of trunk: Secondary | ICD-10-CM | POA: Diagnosis not present

## 2019-03-08 DIAGNOSIS — L720 Epidermal cyst: Secondary | ICD-10-CM | POA: Diagnosis not present

## 2019-03-19 ENCOUNTER — Other Ambulatory Visit: Payer: Self-pay

## 2019-03-22 ENCOUNTER — Other Ambulatory Visit: Payer: Self-pay | Admitting: Family Medicine

## 2019-03-22 NOTE — Telephone Encounter (Signed)
Requested Prescriptions  Pending Prescriptions Disp Refills  . DILT-XR 180 MG 24 hr capsule [Pharmacy Med Name: DILT-XR 180 MG CAP] 60 capsule 0    Sig: TAKE 2 CAPSULES BY MOUTH EVERY DAY     Cardiovascular:  Calcium Channel Blockers Failed - 03/22/2019  3:04 PM      Failed - Valid encounter within last 6 months    Recent Outpatient Visits          11 months ago Encounter for annual physical examination excluding gynecological examination in a patient older than 17 years   [facility] Rosanna Randy, Retia Passe., MD   1 year ago Hypercholesteremia   [facility] Jerrol Banana., MD   1 year ago [place] Jerrol Banana., MD   2 years ago Essential (primary) hypertension   [facility] Jerrol Banana., MD   2 years ago Dysfunction of right eustachian tube   [facility] Jerrol Banana., MD      Future Appointments            In 3 weeks  [facility], [place]   In 3 weeks Jerrol Banana., MD [facility]           Passed - Last BP in normal range    BP Readings from Last 1 Encounters:  04/08/18 132/70

## 2019-03-27 DIAGNOSIS — Z20828 Contact with and (suspected) exposure to other viral communicable diseases: Secondary | ICD-10-CM | POA: Diagnosis not present

## 2019-04-13 ENCOUNTER — Telehealth: Payer: Self-pay

## 2019-04-13 NOTE — Telephone Encounter (Signed)
LMTCB to inquire about r/s her AWV and CPE. Advised pt that the AWV can be telephonically.

## 2019-04-14 ENCOUNTER — Ambulatory Visit: Payer: PPO

## 2019-04-14 ENCOUNTER — Encounter: Payer: PPO | Admitting: Family Medicine
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
## 2019-04-21 ENCOUNTER — Other Ambulatory Visit: Payer: Self-pay | Admitting: Family Medicine

## 2019-04-22 ENCOUNTER — Other Ambulatory Visit: Payer: Self-pay | Admitting: Family Medicine

## 2019-04-22 NOTE — Telephone Encounter (Signed)
Requested medication (s) are due for refill today: yes  Requested medication (s) are on the active medication list: yes  Last refill:  03/22/2019  Future visit scheduled: no  Notes to clinic:  no valid encounter within last 6 months   Requested Prescriptions  Pending Prescriptions Disp Refills   DILT-XR 180 MG 24 hr capsule [Pharmacy Med Name: DILT-XR 180 MG CAP] 60 capsule 0    Sig: TAKE 2 CAPSULES BY MOUTH EVERY DAY      Cardiovascular:  Calcium Channel Blockers Failed - 04/22/2019  8:58 AM      Failed - Valid encounter within last 6 months    Recent Outpatient Visits           1 year ago Encounter for annual physical examination excluding gynecological examination in a patient older than 17 years   [facility] Rosanna Randy, Retia Passe., MD   1 year ago [place] Jerrol Banana., MD   1 year ago [place] Jerrol Banana., MD   2 years ago Essential (primary) hypertension   [facility] Jerrol Banana., MD   2 years ago Dysfunction of right eustachian tube   [facility] Jerrol Banana., MD              Passed - Last BP in normal range    BP Readings from Last 1 Encounters:  04/08/18 132/70

## 2019-04-27 ENCOUNTER — Telehealth: Payer: Self-pay

## 2019-04-27 NOTE — Telephone Encounter (Signed)
Copied from [place] [id number]. Topic: General - Other >> Apr 27, 2019 10:46 AM [name] wrote: Reason for CRM: pt called in to schedule her cpe with PCP. Pt says that she need her refill for medication. While scheduling pt, pt answered yes to previously testing positive for covid over the Thanksgiving holiday. Pt has not been retested but is not having any symptoms at this time.  Pt says that her spouse is currently in the hospital. Please assist pt with apt change per PCP. Pt is scheduled for tomorrow., please assist pt directly.

## 2019-04-27 NOTE — Telephone Encounter (Signed)
Spoke with the patient and she is comfortable doing a Hospital doctor Visit.
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
## 2019-04-28 ENCOUNTER — Telehealth: Payer: Self-pay

## 2019-04-28 ENCOUNTER — Encounter: Payer: Self-pay | Admitting: Family Medicine

## 2019-04-28 ENCOUNTER — Ambulatory Visit (INDEPENDENT_AMBULATORY_CARE_PROVIDER_SITE_OTHER): Payer: PPO | Admitting: Family Medicine

## 2019-04-28 VITALS — BP 120/80

## 2019-04-28 DIAGNOSIS — I10 Essential (primary) hypertension: Secondary | ICD-10-CM

## 2019-04-28 DIAGNOSIS — F3341 Major depressive disorder, recurrent, in partial remission: Secondary | ICD-10-CM

## 2019-04-28 DIAGNOSIS — E7800 Pure hypercholesterolemia, unspecified: Secondary | ICD-10-CM | POA: Diagnosis not present

## 2019-04-28 MED ORDER — ROSUVASTATIN CALCIUM 10 MG PO TABS: 10.0000 mg | ORAL_TABLET | Freq: Every day | ORAL | 3 refills | Status: DC

## 2019-04-28 MED ORDER — DESVENLAFAXINE SUCCINATE ER 50 MG PO TB24: 50.0000 mg | ORAL_TABLET | Freq: Every day | ORAL | 3 refills | Status: DC

## 2019-04-28 MED ORDER — DILTIAZEM HCL ER 180 MG PO CP24: 360.0000 mg | ORAL_CAPSULE | Freq: Every day | ORAL | 11 refills | Status: DC

## 2019-04-28 NOTE — Telephone Encounter (Signed)
Copied from [place] [phone]. Topic: General - Other >> Apr 28, 2019 10:32 AM Rainey Pines A wrote: Patient returned callback in regards to her appt today and would like a callback. Please advise

## 2019-05-05 DIAGNOSIS — E7800 Pure hypercholesterolemia, unspecified: Secondary | ICD-10-CM | POA: Diagnosis not present

## 2019-05-05 DIAGNOSIS — I10 Essential (primary) hypertension: Secondary | ICD-10-CM | POA: Diagnosis not present

## 2019-05-06 ENCOUNTER — Encounter: Payer: Self-pay | Admitting: *Deleted

## 2019-05-06 LAB — COMPREHENSIVE METABOLIC PANEL
ALT: 12 IU/L (ref 0–32)
AST: 22 IU/L (ref 0–40)
Albumin/Globulin Ratio: 2.2 (ref 1.2–2.2)
Albumin: 4.6 g/dL (ref 3.8–4.8)
Alkaline Phosphatase: 67 IU/L (ref 39–117)
BUN/Creatinine Ratio: 13 (ref 12–28)
BUN: 9 mg/dL (ref 8–27)
Bilirubin Total: 0.3 mg/dL (ref 0.0–1.2)
CO2: 23 mmol/L (ref 20–29)
Calcium: 9.2 mg/dL (ref 8.7–10.3)
Chloride: 104 mmol/L (ref 96–106)
Creatinine, Ser: 0.69 mg/dL (ref 0.57–1.00)
GFR calc Af Amer: 103 mL/min/{1.73_m2} (ref >59)
GFR calc non Af Amer: 90 mL/min/{1.73_m2} (ref >59)
Globulin, Total: 2.1 g/dL (ref 1.5–4.5)
Glucose: 99 mg/dL (ref 65–99)
Potassium: 3.8 mmol/L (ref 3.5–5.2)
Sodium: 142 mmol/L (ref 134–144)
Total Protein: 6.7 g/dL (ref 6.0–8.5)

## 2019-05-06 LAB — LIPID PANEL
Chol/HDL Ratio: 2.2 ratio (ref 0.0–4.4)
Cholesterol, Total: 167 mg/dL (ref 100–199)
HDL: 75 mg/dL (ref >39)
LDL Chol Calc (NIH): 80 mg/dL (ref 0–99)
Triglycerides: 59 mg/dL (ref 0–149)
VLDL Cholesterol Cal: 12 mg/dL (ref 5–40)

## 2019-05-06 LAB — CBC WITH DIFFERENTIAL/PLATELET
Basophils Absolute: 0.1 10*3/uL (ref 0.0–0.2)
Basos: 1 %
EOS (ABSOLUTE): 0.1 10*3/uL (ref 0.0–0.4)
Eos: 3 %
Hematocrit: 37.8 % (ref 34.0–46.6)
Hemoglobin: 13 g/dL (ref 11.1–15.9)
Immature Grans (Abs): 0 10*3/uL (ref 0.0–0.1)
Immature Granulocytes: 0 %
Lymphocytes Absolute: 1.1 10*3/uL (ref 0.7–3.1)
Lymphs: 28 %
MCH: 31.9 pg (ref 26.6–33.0)
MCHC: 34.4 g/dL (ref 31.5–35.7)
MCV: 93 fL (ref 79–97)
Monocytes Absolute: 0.3 10*3/uL (ref 0.1–0.9)
Monocytes: 9 %
Neutrophils Absolute: 2.2 10*3/uL (ref 1.4–7.0)
Neutrophils: 59 %
Platelets: 211 10*3/uL (ref 150–450)
RBC: 4.07 x10E6/uL (ref 3.77–5.28)
RDW: 12 % (ref 11.7–15.4)
WBC: 3.8 10*3/uL (ref 3.4–10.8)

## 2019-05-06 LAB — TSH: TSH: 1.64 u[IU]/mL (ref 0.450–4.500)

## 2019-05-13 NOTE — Telephone Encounter (Signed)
Spoke with pt who states she is unable to schedule her telephonic AWV at this time. Requested a CB next week- note made.

## 2019-05-17 NOTE — Telephone Encounter (Signed)
LMTCB to inquire about a telephonic visit.

## 2019-05-24 NOTE — Telephone Encounter (Signed)
Duplicate encounter. This encounter was created in error - please disregard.

## 2019-05-24 NOTE — Telephone Encounter (Signed)
Spoke with pt who states her husband just passed away on 2019-05-25. Pt requested a CB in 2 weeks.

## 2019-06-09 NOTE — Telephone Encounter (Signed)
Scheduled a telephonic AWV for 06/16/19 @ 11:00 AM.

## 2019-06-09 NOTE — Telephone Encounter (Signed)
This encounter was created in error - please disregard.

## 2019-06-15 NOTE — Progress Notes (Signed)
Subjective:   Teresa Duarte is a 69 y.o. female who presents for Medicare Annual (Subsequent) preventive examination.    This visit is being conducted through telemedicine due to the COVID-19 pandemic. This patient has given me verbal consent via doximity to conduct this visit, patient states they are participating from their home address. Some vital signs may be absent or patient reported.    Patient identification: identified by name, DOB, and current address  Review of Systems:  N/A  Cardiac Risk Factors include: advanced age (>35men, >73 women);hypertension;dyslipidemia     Objective:     Vitals: There were no vitals taken for this visit.  There is no height or weight on file to calculate BMI. Unable to obtain vitals due to visit being conducted via telephonically.   Advanced Directives 06/16/2019 04/08/2018 02/07/2016 02/22/2014  Does Patient Have a Medical Advance Directive? Yes No No No  Type of Paramedic of [place];Living will - - -  Copy of [place] in Chart? No - copy requested - - -  Would patient like information on creating a medical advance directive? - Yes (MAU/Ambulatory/Procedural Areas - Information given) No - patient declined information No - patient declined information    Tobacco Social History   Tobacco Use  Smoking Status Never Smoker  Smokeless Tobacco Never Used     Counseling given: Not Answered   Clinical Intake:  Pre-visit preparation completed: Yes  Pain : 0-10 Pain Score: 8  Pain Type: Chronic pain Pain Location: Hand Pain Orientation: Right Pain Descriptors / Indicators: Aching Pain Frequency: Intermittent     Nutritional Risks: None Diabetes: No  How often do you need to have someone help you when you read instructions, pamphlets, or other written materials from your doctor or pharmacy?: 1 - Never  Interpreter Needed?: No  Information entered by :: [facility], LPN  Past Medical  History:  Diagnosis Date  . Anxiety   . Arthritis   . Chronic kidney disease    ?spot on her left kidney  . Depression   . HBP (high blood pressure)   . Heart murmur    doesn't give her any problems  . High cholesterol   . PONV (postoperative nausea and vomiting)    Past Surgical History:  Procedure Laterality Date  . ABDOMINAL HYSTERECTOMY    . CHOLECYSTECTOMY N/A 03/03/2014   Procedure: LAPAROSCOPIC CHOLECYSTECTOMY WITH INTRAOPERATIVE CHOLANGIOGRAM;  Surgeon: Coralie Keens, MD;  Location: [facility];  Service: General;  Laterality: N/A;  . COLONOSCOPY  2013  . FOOT SURGERY Bilateral   . SHOULDER SURGERY Right   . TENDON REPAIR Left    tendon/ligament repair, pt not sure which  . TONSILLECTOMY     Family History  Problem Relation Age of Onset  . Hypertension Mother   . Hypercholesterolemia Mother   . Heart attack Father   . Depression Sister   . Depression Sister   . Hypertension Sister   . Hypercholesterolemia Sister   . Depression Son   . Anxiety disorder Son   . Breast cancer Neg Hx    Social History   Socioeconomic History  . Marital status: Widowed    Spouse name: Not on file  . Number of children: 2  . Years of education: Not on file  . Highest education level: High school graduate  Occupational History  . Occupation: retired  Tobacco Use  . Smoking status: Never Smoker  . Smokeless tobacco: Never Used  Substance and Sexual Activity  .  Alcohol use: Yes    Alcohol/week: 21.0 standard drinks    Types: 21 Glasses of wine per week    Comment: 3 glasses of wine nightly  . Drug use: No  . Sexual activity: Yes    Partners: Male    Comment: married, husband has a hx of liver failurem alcohol and drug use with Hep C  Other Topics Concern  . Not on file  Social History Narrative  . Not on file   Social Determinants of Health   Financial Resource Strain: Low Risk   . Difficulty of Paying Living Expenses: Not hard at all  Food Insecurity: No Food  Insecurity  . Worried About Charity fundraiser in the Last Year: Never true  . Ran Out of Food in the Last Year: Never true  Transportation Needs: No Transportation Needs  . Lack of Transportation (Medical): No  . Lack of Transportation (Non-Medical): No  Physical Activity: Inactive  . Days of Exercise per Week: 0 days  . Minutes of Exercise per Session: 0 min  Stress: Stress Concern Present  . Feeling of Stress : Very much  Social Connections: Somewhat Isolated  . Frequency of Communication with Friends and Family: More than three times a week  . Frequency of Social Gatherings with Friends and Family: More than three times a week  . Attends Religious Services: More than 4 times per year  . Active Member of Clubs or Organizations: No  . Attends Archivist Meetings: Never  . Marital Status: Widowed    Outpatient Encounter Medications as of 06/16/2019  Medication Sig  . Cholecalciferol (VITAMIN D3) 50 MCG (2000 UT) capsule Take 2,000 Units by mouth daily.  . citalopram (CELEXA) 20 MG tablet Take 1 tablet (20 mg total) by mouth daily.  Marland Kitchen desvenlafaxine (PRISTIQ) 50 MG 24 hr tablet Take 1 tablet (50 mg total) by mouth daily.  Marland Kitchen diltiazem (DILT-XR) 180 MG 24 hr capsule Take 2 capsules (360 mg total) by mouth daily.  Marland Kitchen loratadine (CLARITIN) 10 MG tablet Take 10 mg by mouth daily.  . rosuvastatin (CRESTOR) 10 MG tablet Take 1 tablet (10 mg total) by mouth daily.  . valACYclovir (VALTREX) 500 MG tablet TAKE 1 TABLET BY MOUTH DAILY (Patient taking differently: No sig reported)  . alendronate (FOSAMAX) 70 MG tablet TAKE 1 TABLET ONCE A WEEK  . b complex vitamins tablet Take 1 tablet by mouth daily.  . chlordiazePOXIDE (LIBRIUM) 25 MG capsule TAKE ONE CAPSULE EVERY SIX HOURS AS NEEDED (Patient not taking: Reported on 06/16/2019)   No facility-administered encounter medications on file as of 06/16/2019.    Activities of Daily Living In your present state of health, do you have any  difficulty performing the following activities: 06/16/2019 04/28/2019  Hearing? Y Y  Comment Due to tinnitus. -  Vision? N N  Difficulty concentrating or making decisions? N N  Walking or climbing stairs? N N  Dressing or bathing? N N  Doing errands, shopping? N N  Preparing Food and eating ? N -  Using the Toilet? N -  In the past six months, have you accidently leaked urine? Y -  Comment Occasionally in the morning. -  Do you have problems with loss of bowel control? N -  Managing your Medications? N -  Managing your Finances? N -  Housekeeping or managing your Housekeeping? N -  Some recent data might be hidden    Patient Care Team: Jerrol Banana., MD as PCP -  General (Family Medicine) Benjaman Kindler, MD as Consulting Physician (Obstetrics and Gynecology) Estill Cotta, MD (Ophthalmology) Richmond Campbell, MD as Consulting Physician (Gastroenterology) Roseanne Kaufman, MD as Consulting Physician (Orthopedic Surgery) Jarome Matin, MD as Consulting Physician (Dermatology)    Assessment:   This is a routine wellness examination for [place].  Exercise Activities and Dietary recommendations Current Exercise Habits: The patient does not participate in regular exercise at present, Exercise limited by: None identified  Goals    . Reduce alcohol intake     Recommend cutting back on alcohol consumption to 1 glass of a wine a day.        Fall Risk: Fall Risk  06/16/2019 04/28/2019 03/19/2019 04/08/2018 05/08/2017  Falls in the past year? 0 0 0 0 No  Comment - - Emmi Telephone Survey: data to providers prior to load - -  Number falls in past yr: 0 0 - - -  Injury with Fall? 0 0 - - -    FALL RISK PREVENTION PERTAINING TO THE HOME:  Any stairs in or around the home? Yes  If so, are there any without handrails? No   Home free of loose throw rugs in walkways, pet beds, electrical cords, etc? Yes  Adequate lighting in your home to reduce risk of falls? Yes    ASSISTIVE DEVICES UTILIZED TO PREVENT FALLS:  Life alert? No  Use of a cane, walker or w/c? No  Grab bars in the bathroom? Yes  Shower chair or bench in shower? No  Elevated toilet seat or a handicapped toilet? No    TIMED UP AND GO:  Was the test performed? No .    Depression Screen PHQ 2/9 Scores 06/16/2019 04/08/2018 05/08/2017 02/07/2016  PHQ - 2 Score 3 0 2 6  PHQ- 9 Score 6 - 4 11     Cognitive Function: Declined today.         Immunization History  Administered Date(s) Administered  . Influenza, High Dose Seasonal PF 02/07/2016, 02/16/2019  . Influenza,inj,Quad PF,6+ Mos 02/24/2013, 02/17/2014  . Influenza-Unspecified 03/09/2018  . Pneumococcal Conjugate-13 02/07/2016  . Pneumococcal Polysaccharide-23 04/08/2018    Qualifies for Shingles Vaccine? Yes . Due for Shingrix. Pt has been advised to call insurance company to determine out of pocket expense. Advised may also receive vaccine at local pharmacy or Health Dept. Verbalized acceptance and understanding.  Tdap: Although this vaccine is not a covered service during a Wellness Exam, does the patient still wish to receive this vaccine today?  No . Advised may receive this vaccine at local pharmacy or Health Dept. Aware to provide a copy of the vaccination record if obtained from local pharmacy or Health Dept. Verbalized acceptance and understanding.  Flu Vaccine: Up to date  Pneumococcal Vaccine: Completed series  Screening Tests Health Maintenance  Topic Date Due  . DEXA SCAN  11/29/2018  . TETANUS/TDAP  06/15/2020 (Originally 11/03/1969)  . MAMMOGRAM  12/25/2019  . COLONOSCOPY  12/05/2020  . INFLUENZA VACCINE  Completed  . Hepatitis C Screening  Completed  . PNA vac Low Risk Adult  Completed    Cancer Screenings:  Colorectal Screening: Completed 12/06/10. Repeat every 10 years.   Mammogram: Completed 12/24/17. Repeat every 1-2 years as advised.   Bone Density: Completed 11/28/16. Results reflect  OSTEOPOROSIS. Repeat every 2 years. Ordered today. Pt provided with contact info and advised to call to schedule appt. Pt aware the office will call re: appt.  Lung Cancer Screening: (Low Dose CT Chest recommended if Age  55-80 years, 30 pack-year currently smoking OR have quit w/in 15years.) does not qualify.   Additional Screening:  Hepatitis C Screening: Up to date  Vision Screening: Recommended annual ophthalmology exams for early detection of glaucoma and other disorders of the eye.  Dental Screening: Recommended annual dental exams for proper oral hygiene  Community Resource Referral:  CRR required this visit?  No       Plan:  I have personally reviewed and addressed the Medicare Annual Wellness questionnaire and have noted the following in the patient's chart:  A. Medical and social history B. Use of alcohol, tobacco or illicit drugs  C. Current medications and supplements D. Functional ability and status E.  Nutritional status F.  Physical activity G. Advance directives H. List of other physicians I.  Hospitalizations, surgeries, and ER visits in previous 12 months J.  [place] such as hearing and vision if needed, cognitive and depression L. Referrals and appointments   In addition, I have reviewed and discussed with patient certain preventive protocols, quality metrics, and best practice recommendations. A written personalized care plan for preventive services as well as general preventive health recommendations were provided to patient. Nurse Health Advisor  Signed,    Elby Blackwelder [place], [place]  [id number] Nurse Health Advisor   Nurse Notes: None.

## 2019-06-16 ENCOUNTER — Other Ambulatory Visit: Payer: Self-pay

## 2019-06-16 ENCOUNTER — Ambulatory Visit (INDEPENDENT_AMBULATORY_CARE_PROVIDER_SITE_OTHER): Payer: PPO

## 2019-06-16 DIAGNOSIS — E2839 Other primary ovarian failure: Secondary | ICD-10-CM | POA: Diagnosis not present

## 2019-06-16 DIAGNOSIS — Z0000 Encounter for general adult medical examination without abnormal findings: Secondary | ICD-10-CM | POA: Diagnosis not present

## 2019-06-16 DIAGNOSIS — I10 Essential (primary) hypertension: Secondary | ICD-10-CM

## 2019-06-16 MED ORDER — DILTIAZEM HCL ER 180 MG PO CP24: 360.0000 mg | ORAL_CAPSULE | Freq: Every day | ORAL | 3 refills | Status: DC

## 2019-06-16 NOTE — Telephone Encounter (Signed)
Spoke with pt today to complete her AWV telephonically and pt requested a 90 day supply of Diltiazem 180 mg to be sent to the pharmacy on file. Pt states she was previously given a 30 day supply. Please advise, thank you.

## 2019-06-16 NOTE — Patient Instructions (Addendum)
Teresa Duarte , Thank you for taking time to come for your Medicare Wellness Visit. I appreciate your ongoing commitment to your health goals. Please review the following plan we discussed and let me know if I can assist you in the future.   Screening recommendations/referrals: Colonoscopy: Up to date, due 11/2020 Mammogram: Up to date, due 8/20201 Bone Density: Currently due, ordered today. Pt aware the office will call re: appt. Recommended yearly ophthalmology/optometry visit for glaucoma screening and checkup Recommended yearly dental visit for hygiene and checkup  Vaccinations: Influenza vaccine: Up to date Pneumococcal vaccine: Completed series Tdap vaccine: Pt declines today.  Shingles vaccine: Pt declines today.     Advanced directives: Please bring a copy of your POA (Power of Attorney) and/or Living Will to your next appointment.   Conditions/risks identified: Recommend to continue to try and cut back on wine intake from 3 glasses a night to 1 glass a night.  Next appointment: 06/20/20 @ 1:20 PM. Declined scheduling a follow up with PCP at this time.    Preventive Care 35 Years and Older, Female Preventive care refers to lifestyle choices and visits with your health care provider that can promote health and wellness. What does preventive care include?  A yearly physical exam. This is also called an annual well check.  Dental exams once or twice a year.  Routine eye exams. Ask your health care provider how often you should have your eyes checked.  Personal lifestyle choices, including:  Daily care of your teeth and gums.  Regular physical activity.  Eating a healthy diet.  Avoiding tobacco and drug use.  Limiting alcohol use.  Practicing safe sex.  Taking low-dose aspirin every day.  Taking vitamin and mineral supplements as recommended by your health care provider. What happens during an annual well check? The services and screenings done by your health care  provider during your annual well check will depend on your age, overall health, lifestyle risk factors, and family history of disease. Counseling  Your health care provider may ask you questions about your:  Alcohol use.  Tobacco use.  Drug use.  Emotional well-being.  Home and relationship well-being.  Sexual activity.  Eating habits.  History of falls.  Memory and ability to understand (cognition).  Work and work Statistician.  Reproductive health. Screening  You may have the following tests or measurements:  Height, weight, and BMI.  Blood pressure.  Lipid and cholesterol levels. These may be checked every 5 years, or more frequently if you are over 32 years old.  Skin check.  Lung cancer screening. You may have this screening every year starting at age 55 if you have a 30-pack-year history of smoking and currently smoke or have quit within the past 15 years.  Fecal occult blood test (FOBT) of the stool. You may have this test every year starting at age 74.  Flexible sigmoidoscopy or colonoscopy. You may have a sigmoidoscopy every 5 years or a colonoscopy every 10 years starting at age 78.  Hepatitis C blood test.  Hepatitis B blood test.  Sexually transmitted disease (STD) testing.  Diabetes screening. This is done by checking your blood sugar (glucose) after you have not eaten for a while (fasting). You may have this done every 1-3 years.  Bone density scan. This is done to screen for osteoporosis. You may have this done starting at age 64.  Mammogram. This may be done every 1-2 years. Talk to your health care provider about how often you  should have regular mammograms. Talk with your health care provider about your test results, treatment options, and if necessary, the need for more tests. Vaccines  Your health care provider may recommend certain vaccines, such as:  Influenza vaccine. This is recommended every year.  Tetanus, diphtheria, and acellular  pertussis (Tdap, Td) vaccine. You may need a Td booster every 10 years.  Zoster vaccine. You may need this after age 30.  Pneumococcal 13-valent conjugate (PCV13) vaccine. One dose is recommended after age 17.  Pneumococcal polysaccharide (PPSV23) vaccine. One dose is recommended after age 80. Talk to your health care provider about which screenings and vaccines you need and how often you need them. This information is not intended to replace advice given to you by your health care provider. Make sure you discuss any questions you have with your health care provider. Document Released: 05/12/2015 Document Revised: 01/03/2016 Document Reviewed: 02/14/2015 Elsevier Interactive Patient Education  2017 [employer] Prevention in the Home Falls can cause injuries. They can happen to people of all ages. There are many things you can do to make your home safe and to help prevent falls. What can I do on the outside of my home?  Regularly fix the edges of walkways and driveways and fix any cracks.  Remove anything that might make you trip as you walk through a door, such as a raised step or threshold.  Trim any bushes or trees on the path to your home.  Use bright outdoor lighting.  Clear any walking paths of anything that might make someone trip, such as rocks or tools.  Regularly check to see if handrails are loose or broken. Make sure that both sides of any steps have handrails.  Any raised decks and porches should have guardrails on the edges.  Have any leaves, snow, or ice cleared regularly.  Use sand or salt on walking paths during winter.  Clean up any spills in your garage right away. This includes oil or grease spills. What can I do in the bathroom?  Use night lights.  Install grab bars by the toilet and in the tub and shower. Do not use towel bars as grab bars.  Use non-skid mats or decals in the tub or shower.  If you need to sit down in the shower, use a plastic,  non-slip stool.  Keep the floor dry. Clean up any water that spills on the floor as soon as it happens.  Remove soap buildup in the tub or shower regularly.  Attach bath mats securely with double-sided non-slip rug tape.  Do not have throw rugs and other things on the floor that can make you trip. What can I do in the bedroom?  Use night lights.  Make sure that you have a light by your bed that is easy to reach.  Do not use any sheets or blankets that are too big for your bed. They should not hang down onto the floor.  Have a firm chair that has side arms. You can use this for support while you get dressed.  Do not have throw rugs and other things on the floor that can make you trip. What can I do in the kitchen?  Clean up any spills right away.  Avoid walking on wet floors.  Keep items that you use a lot in easy-to-reach places.  If you need to reach something above you, use a strong step stool that has a grab bar.  Keep electrical cords out  of the way.  Do not use floor polish or wax that makes floors slippery. If you must use wax, use non-skid floor wax.  Do not have throw rugs and other things on the floor that can make you trip. What can I do with my stairs?  Do not leave any items on the stairs.  Make sure that there are handrails on both sides of the stairs and use them. Fix handrails that are broken or loose. Make sure that handrails are as long as the stairways.  Check any carpeting to make sure that it is firmly attached to the stairs. Fix any carpet that is loose or worn.  Avoid having throw rugs at the top or bottom of the stairs. If you do have throw rugs, attach them to the floor with carpet tape.  Make sure that you have a light switch at the top of the stairs and the bottom of the stairs. If you do not have them, ask someone to add them for you. What else can I do to help prevent falls?  Wear shoes that:  Do not have high heels.  Have rubber  bottoms.  Are comfortable and fit you well.  Are closed at the toe. Do not wear sandals.  If you use a stepladder:  Make sure that it is fully opened. Do not climb a closed stepladder.  Make sure that both sides of the stepladder are locked into place.  Ask someone to hold it for you, if possible.  Clearly mark and make sure that you can see:  Any grab bars or handrails.  First and last steps.  Where the edge of each step is.  Use tools that help you move around (mobility aids) if they are needed. These include:  Canes.  Walkers.  Scooters.  Crutches.  Turn on the lights when you go into a dark area. Replace any light bulbs as soon as they burn out.  Set up your furniture so you have a clear path. Avoid moving your furniture around.  If any of your floors are uneven, fix them.  If there are any pets around you, be aware of where they are.  Review your medicines with your doctor. Some medicines can make you feel dizzy. This can increase your chance of falling. Ask your doctor what other things that you can do to help prevent falls. This information is not intended to replace advice given to you by your health care provider. Make sure you discuss any questions you have with your health care provider. Document Released: 02/09/2009 Document Revised: 09/21/2015 Document Reviewed: 05/20/2014 Elsevier Interactive Patient Education  2017 [employer].

## 2019-06-24 ENCOUNTER — Telehealth: Payer: Self-pay

## 2019-06-24 DIAGNOSIS — Z1231 Encounter for screening mammogram for malignant neoplasm of breast: Secondary | ICD-10-CM

## 2019-06-24 NOTE — Telephone Encounter (Signed)
Copied from [place] [phone]. Topic: General - Other >> Jun 23, 2019  5:51 PM Parke Poisson wrote: Reason for CRM: Pt is requesting that order be placed for her annual TOMO mammogram. She wants to do her mammogram and bone density on same day. Early afternooon

## 2019-06-24 NOTE — Telephone Encounter (Signed)
Order for mammogram is in epic.

## 2019-07-22 ENCOUNTER — Ambulatory Visit
Admission: RE | Admit: 2019-07-22 | Discharge: 2019-07-22 | Disposition: A | Discharge status: Home / Self Care | Payer: PPO | Source: Ambulatory Visit | Attending: Family Medicine | Admitting: Family Medicine

## 2019-07-22 DIAGNOSIS — Z1231 Encounter for screening mammogram for malignant neoplasm of breast: Secondary | ICD-10-CM | POA: Diagnosis not present

## 2019-07-22 DIAGNOSIS — E2839 Other primary ovarian failure: Secondary | ICD-10-CM | POA: Insufficient documentation

## 2019-07-22 DIAGNOSIS — M8589 Other specified disorders of bone density and structure, multiple sites: Secondary | ICD-10-CM | POA: Diagnosis not present

## 2019-07-23 ENCOUNTER — Telehealth: Payer: Self-pay | Admitting: *Deleted

## 2019-07-23 DIAGNOSIS — Z20822 Contact with and (suspected) exposure to covid-19: Secondary | ICD-10-CM

## 2019-07-23 NOTE — Telephone Encounter (Signed)
Patient wants to get covid antibodies labs done. Please advise?

## 2019-07-27 NOTE — Telephone Encounter (Signed)
Okay to order this.  I doubt insurance covers it.

## 2019-07-27 NOTE — Telephone Encounter (Signed)
Patient advised that lab was ordered for Covid antibodies.

## 2019-07-29 DIAGNOSIS — Z20822 Contact with and (suspected) exposure to covid-19: Secondary | ICD-10-CM | POA: Diagnosis not present

## 2019-07-30 LAB — SAR COV2 SEROLOGY (COVID19)AB(IGG),IA: DiaSorin SARS-CoV-2 Ab, IgG: POSITIVE

## 2019-08-12 ENCOUNTER — Other Ambulatory Visit: Payer: Self-pay | Admitting: Family Medicine

## 2019-09-16 ENCOUNTER — Ambulatory Visit (INDEPENDENT_AMBULATORY_CARE_PROVIDER_SITE_OTHER): Payer: PPO | Admitting: Physician Assistant

## 2019-09-16 ENCOUNTER — Other Ambulatory Visit: Payer: Self-pay

## 2019-09-16 ENCOUNTER — Encounter: Payer: Self-pay | Admitting: Physician Assistant

## 2019-09-16 VITALS — BP 148/92 | HR 85 | Temp 97.1°F | Wt 138.0 lb

## 2019-09-16 DIAGNOSIS — M545 Low back pain, unspecified: Secondary | ICD-10-CM

## 2019-09-16 MED ORDER — CYCLOBENZAPRINE HCL 5 MG PO TABS: 5.0000 mg | ORAL_TABLET | Freq: Three times a day (TID) | ORAL | 0 refills | Status: DC | PRN

## 2019-09-16 NOTE — Patient Instructions (Signed)

## 2019-09-16 NOTE — Progress Notes (Signed)
Established patient visit   Patient: Teresa Duarte   DOB: 1951-03-30   69 y.o. Female  MRN: [id number] Visit Date: 09/16/2019  Today's healthcare provider: Trinna Post, PA-C   Chief Complaint  Patient presents with  . Back Pain  I,Gloristine Turrubiates M Krystin Keeven,acting as a scribe for Trinna Post, PA-C.,have documented all relevant documentation on the behalf of Trinna Post, PA-C,as directed by  Trinna Post, PA-C while in the presence of Trinna Post, PA-C.  Subjective    Back Pain This is a new problem. The current episode started in the past 7 days. The problem occurs constantly. The problem is unchanged. The quality of the pain is described as shooting and stabbing. The pain is at a severity of 8/10. The pain is moderate. The pain is the same all the time. The symptoms are aggravated by bending, position, standing, twisting, lying down and sitting. Stiffness is present in the morning. Pertinent negatives include no abdominal pain, dysuria, numbness, tingling or weakness. She has tried NSAIDs, bed rest, heat and ice for the symptoms. The treatment provided mild relief.  Patient states that Monday,09/13/2019 she was doing yard work and carrying bags of dirt. Patient reports she uses Lidocaine patches and took 3 Advil at 8:30 AM and 12:20 PM. She states at 1:15 PM is started feeling better.      Medications: Outpatient Medications Prior to Visit  Medication Sig  . b complex vitamins tablet Take 1 tablet by mouth daily.  . Cholecalciferol (VITAMIN D3) 50 MCG (2000 UT) capsule Take 2,000 Units by mouth daily.  . citalopram (CELEXA) 20 MG tablet TAKE 1 TABLET BY MOUTH DAILY  . desvenlafaxine (PRISTIQ) 50 MG 24 hr tablet Take 1 tablet (50 mg total) by mouth daily.  Marland Kitchen diltiazem (DILT-XR) 180 MG 24 hr capsule Take 2 capsules (360 mg total) by mouth daily.  Marland Kitchen loratadine (CLARITIN) 10 MG tablet Take 10 mg by mouth daily.  . rosuvastatin (CRESTOR) 10 MG tablet Take 1 tablet (10 mg  total) by mouth daily.  . valACYclovir (VALTREX) 500 MG tablet TAKE 1 TABLET BY MOUTH DAILY (Patient taking differently: No sig reported)  . alendronate (FOSAMAX) 70 MG tablet TAKE 1 TABLET ONCE A WEEK  . chlordiazePOXIDE (LIBRIUM) 25 MG capsule TAKE ONE CAPSULE EVERY SIX HOURS AS NEEDED (Patient not taking: Reported on 06/16/2019)   No facility-administered medications prior to visit.    Review of Systems  Gastrointestinal: Negative for abdominal pain.  Genitourinary: Negative for dysuria.  Musculoskeletal: Positive for back pain.  Neurological: Negative for tingling, weakness and numbness.      Objective    BP (!) 148/92 (BP Location: Left Arm, Patient Position: Sitting, Cuff Size: Normal)   Pulse 85   Temp (!) 97.1 F (36.2 C) (Temporal)   Wt 138 lb (62.6 kg)   SpO2 99%   BMI 24.45 kg/m    Physical Exam Constitutional:      Appearance: Normal appearance.  Musculoskeletal:     Cervical back: Normal.     Thoracic back: Normal.     Lumbar back: Tenderness present.  Skin:    General: Skin is warm and dry.  Neurological:     General: No focal deficit present.     Mental Status: She is alert and oriented to person, place, and time.     Deep Tendon Reflexes:     Reflex Scores:      Patellar reflexes are 2+ on the right side and  2+ on the left side. Psychiatric:        Mood and Affect: Mood normal.        Behavior: Behavior normal.       No results found for any visits on 09/16/19.  Assessment & Plan    1. Acute low back pain, unspecified back pain laterality, unspecified whether sciatica present Patient has lower back pain after doing yard work on Monday, 09/13/2019. Patient has been taking Advil every 4 hours to help with back pain. Patient was prescribed Flexeril 5 mg as below. May be easier to pay cash price for flexeril. Likely MSK strain. Follow up PRN.  - cyclobenzaprine (FLEXERIL) 5 MG tablet; Take 1 tablet (5 mg total) by mouth 3 (three) times daily as needed  for muscle spasms.  Dispense: 30 tablet; Refill: 0   Return if symptoms worsen or fail to improve.      ITrinna Post, PA-C, have reviewed all documentation for this visit. The documentation on 09/16/19 for the exam, diagnosis, procedures, and orders are all accurate and complete.    Paulene Floor  [facility] [phone] (phone) [phone] (fax)  [place]

## 2019-10-07 DIAGNOSIS — F4323 Adjustment disorder with mixed anxiety and depressed mood: Secondary | ICD-10-CM | POA: Diagnosis not present

## 2019-10-14 DIAGNOSIS — F4323 Adjustment disorder with mixed anxiety and depressed mood: Secondary | ICD-10-CM | POA: Diagnosis not present

## 2019-10-25 DIAGNOSIS — J301 Allergic rhinitis due to pollen: Secondary | ICD-10-CM | POA: Diagnosis not present

## 2019-10-25 DIAGNOSIS — L299 Pruritus, unspecified: Secondary | ICD-10-CM | POA: Diagnosis not present

## 2019-10-28 DIAGNOSIS — F4323 Adjustment disorder with mixed anxiety and depressed mood: Secondary | ICD-10-CM | POA: Diagnosis not present

## 2019-11-04 DIAGNOSIS — F4323 Adjustment disorder with mixed anxiety and depressed mood: Secondary | ICD-10-CM | POA: Diagnosis not present

## 2019-11-19 ENCOUNTER — Other Ambulatory Visit: Payer: Self-pay | Admitting: Family Medicine

## 2019-11-22 ENCOUNTER — Other Ambulatory Visit: Payer: Self-pay | Admitting: Otolaryngology

## 2019-11-22 ENCOUNTER — Ambulatory Visit: Payer: Self-pay | Admitting: Family Medicine

## 2019-11-22 DIAGNOSIS — K116 Mucocele of salivary gland: Secondary | ICD-10-CM

## 2019-11-22 NOTE — Telephone Encounter (Signed)
Pt. Called to discuss ongoing discomfort in right ear and right side of throat.  Wants direction if she should see ENT or PCP.  Pt. Reported has had right ear discomfort about 2 mos.  Reported " it feels like something is crawling in it."  Also described a "full feeling in the right ear."  Stated she saw an ENT specialist about 10 -14 days ago.  Now has noted that the fullness is still present in right ear and has a swollen right salivary gland, and redness in throat.  Stated it does not hurt to swallow.  Reported right ear discomfort is moderate to severe.  Has been taking Ibuprofen at night to help her rest.  Denied fever.  Stated she has an appt. With the ENT today at 3:00 PM, and questioned if she should keep this or see her PCP.  Advised to keep appt. With ENT for continuity from his evaluation 2 weeks ago.  Verb. Understanding.     Will route note to Dr. Rosanna Randy also.    Reason for Disposition  [1] SEVERE pain AND [2] not improved 2 hours after taking analgesic medication (e.g., ibuprofen or acetaminophen)    Pt. Has an appt. With ENT today at 3:00 PM; encouraged to keep that appt.  Answer Assessment - Initial Assessment Questions 1. LOCATION: "Which ear is involved?"     Right ear discomfort; feels like a "fullness" 2. ONSET: "When did the ear start hurting"      About 2 mos.  3. SEVERITY: "How bad is the pain?"  (Scale 1-10; mild, moderate or severe)   - MILD (1-3): doesn't interfere with normal activities    - MODERATE (4-7): interferes with normal activities or awakens from sleep    - SEVERE (8-10): excruciating pain, unable to do any normal activities      Moderate to severe  4. URI SYMPTOMS: "Do you have a runny nose or cough?"     "I just have allergies"  5. FEVER: "Do you have a fever?" If Yes, ask: "What is your temperature, how was it measured, and when did it start?"     Denied 6. CAUSE: "Have you been swimming recently?", "How often do you use Q-TIPS?", "Have you had any  recent air travel or scuba diving?"     Denied swimming or air travel  7. OTHER SYMPTOMS: "Do you have any other symptoms?" (e.g., headache, stiff neck, dizziness, vomiting, runny nose, decreased hearing)     Right ear itches, right salivary gland swollen, redness in throat, but denied sore throat; denied fever; denied difficulty swallowing, denied any runny nose; stated "I just have allergies."  8. PREGNANCY: "Is there any chance you are pregnant?" "When was your last menstrual period?"     N/a  Protocols used: EARACHE-A-AH

## 2019-12-06 ENCOUNTER — Ambulatory Visit
Admission: RE | Admit: 2019-12-06 | Discharge: 2019-12-06 | Disposition: A | Discharge status: Home / Self Care | Payer: PPO | Source: Ambulatory Visit | Attending: Otolaryngology | Admitting: Otolaryngology

## 2019-12-06 ENCOUNTER — Other Ambulatory Visit: Payer: Self-pay

## 2019-12-06 DIAGNOSIS — I6522 Occlusion and stenosis of left carotid artery: Secondary | ICD-10-CM | POA: Diagnosis not present

## 2019-12-06 DIAGNOSIS — R221 Localized swelling, mass and lump, neck: Secondary | ICD-10-CM | POA: Diagnosis not present

## 2019-12-06 DIAGNOSIS — K116 Mucocele of salivary gland: Secondary | ICD-10-CM

## 2019-12-06 LAB — POCT I-STAT CREATININE: Creatinine, Ser: 0.7 mg/dL (ref 0.44–1.00)

## 2019-12-06 MED ORDER — IOHEXOL 300 MG/ML  SOLN
75.0000 mL | Freq: Once | INTRAMUSCULAR | Status: AC | PRN
  Administered 2019-12-06: 75 mL via INTRAVENOUS

## 2019-12-08 ENCOUNTER — Telehealth: Payer: Self-pay

## 2019-12-08 NOTE — Telephone Encounter (Signed)
Patient advised.

## 2019-12-08 NOTE — Telephone Encounter (Signed)
I have not seen anything from the ENT office but if there are results from them she needs to call them to check on it.

## 2019-12-08 NOTE — Telephone Encounter (Signed)
Copied from [place] [id number]. Topic: General - Other >> Dec 08, 2019  1:41 PM Yvette Rack wrote: Reason for CRM: Pt called to see if her results from the ENT office had been forwarded. Pt stated she can see the results on [facility] but does not understand them. Pt requests call back.

## 2020-02-15 ENCOUNTER — Other Ambulatory Visit: Payer: Self-pay | Admitting: Family Medicine

## 2020-02-15 DIAGNOSIS — E7800 Pure hypercholesterolemia, unspecified: Secondary | ICD-10-CM

## 2020-02-22 ENCOUNTER — Ambulatory Visit: Payer: PPO | Admitting: Family Medicine

## 2020-02-22 ENCOUNTER — Telehealth (INDEPENDENT_AMBULATORY_CARE_PROVIDER_SITE_OTHER): Payer: PPO | Admitting: Family Medicine

## 2020-02-22 DIAGNOSIS — F5102 Adjustment insomnia: Secondary | ICD-10-CM | POA: Diagnosis not present

## 2020-02-22 DIAGNOSIS — F411 Generalized anxiety disorder: Secondary | ICD-10-CM

## 2020-02-22 DIAGNOSIS — F3341 Major depressive disorder, recurrent, in partial remission: Secondary | ICD-10-CM | POA: Diagnosis not present

## 2020-02-22 MED ORDER — CYCLOBENZAPRINE HCL 5 MG PO TABS: 5.0000 mg | ORAL_TABLET | Freq: Every day | ORAL | 3 refills | Status: DC

## 2020-02-22 MED ORDER — DESVENLAFAXINE SUCCINATE ER 100 MG PO TB24: 100.0000 mg | ORAL_TABLET | Freq: Every day | ORAL | 3 refills | Status: DC

## 2020-02-22 NOTE — Progress Notes (Signed)
Virtual Visit via Telephone Note  I connected with Teresa Duarte on 02/22/20 at  4:00 PM EDT by telephone and verified that I am speaking with the correct person using two identifiers.  Location: Patient: Writer: Office   I discussed the limitations, risks, security and privacy concerns of performing an evaluation and management service by telephone and the availability of in person appointments. I also discussed with the patient that there may be a patient responsible charge related to this service. The patient expressed understanding and agreed to proceed.   History of Present Illness: 69 year old female with a long history of anxiety and depression lost her husband earlier this year due to motorcycle accident.  This past week she had to take her son to [place] for rehab for polysubstance abuse including heroin and methamphetamine.  He has been an addict for some time.  He states she is having trouble sleeping at night and having muscle cramps but also is overwhelmed and depressed.  She is in no way suicidal.  She is presently on citalopram and Pristiq.  In the spring when she took Flexeril for her back spasm it did help her sleep.   Observations/Objective: Patient is alert and oriented today and states she has no intention of hurting herself.  She just wants to feel better.  Assessment and Plan: 1. Recurrent major depressive disorder, in partial remission ([place]) At this time stop Celexa to avoid too much serotonin we will double Pristiq from 50 to 100 mg daily.  2. Anxiety, generalized I recommend she get back in touch with her grief counselor who she saw after her husband died.  I think counseling will be the most appropriate thing for her dealing with her son hopefully staying in rehab in [place].  3. Adjustment insomnia Add Flexeril 10 mg nightly.  I will see her back in 1 month.   Follow Up Instructions:    I discussed the assessment and treatment plan with the patient. The  patient was provided an opportunity to ask questions and all were answered. The patient agreed with the plan and demonstrated an understanding of the instructions.   The patient was advised to call back or seek an in-person evaluation if the symptoms worsen or if the condition fails to improve as anticipated.  I provided 12 minutes of non-face-to-face time during this encounter.   [name], MD

## 2020-03-29 ENCOUNTER — Other Ambulatory Visit: Payer: Self-pay

## 2020-03-29 ENCOUNTER — Encounter: Payer: Self-pay | Admitting: Family Medicine

## 2020-03-29 ENCOUNTER — Ambulatory Visit (INDEPENDENT_AMBULATORY_CARE_PROVIDER_SITE_OTHER): Payer: PPO | Admitting: Family Medicine

## 2020-03-29 VITALS — BP 149/85 | HR 83 | Temp 99.1°F | Wt 139.0 lb

## 2020-03-29 DIAGNOSIS — I10 Essential (primary) hypertension: Secondary | ICD-10-CM | POA: Diagnosis not present

## 2020-03-29 DIAGNOSIS — F3341 Major depressive disorder, recurrent, in partial remission: Secondary | ICD-10-CM | POA: Diagnosis not present

## 2020-03-29 DIAGNOSIS — E7800 Pure hypercholesterolemia, unspecified: Secondary | ICD-10-CM | POA: Diagnosis not present

## 2020-03-29 NOTE — Progress Notes (Signed)
Established patient visit   Patient: Teresa Duarte   DOB: 1950/09/12   69 y.o. Female  MRN: [id number] Visit Date: 03/29/2020  Today's healthcare provider: [name], MD   No chief complaint on file.  Subjective    HPI  Patient's husband died earlier this year from a motor vehicle accident.  She is recently moved houses. Her son is meth addict and heroin addict and has been in and out of 3 rehabs in the past couple of months. She is very worried about him but overall is doing fairly well considering everything that she has been through. Hypertension, follow-up  BP Readings from Last 3 Encounters:  03/29/20 (!) 149/85  09/16/19 (!) 148/92  04/28/19 120/80   Wt Readings from Last 3 Encounters:  03/29/20 139 lb (63 kg)  09/16/19 138 lb (62.6 kg)  04/08/18 135 lb (61.2 kg)     She was last seen for hypertension 11 months ago.  BP at that visit was 120/80. Management since that visit includes; on diltiazem. She reports excellent compliance with treatment. She is not having side effects.   Outside blood pressures are not being checked at home.  She does not smoke.  Use of agents associated with hypertension: none.   --------------------------------------------------------------------------------------------------- Depression, Follow-up  She  was last seen for this 1 months ago. Changes made at last visit include; At this time stop Celexa to avoid too much serotonin we will double Pristiq from 50 to 100 mg daily.   She reports excellent compliance with treatment. She is not having side effects.   She reports excellent tolerance of treatment. Current symptoms include: depressed mood She feels she is Improved since last visit.  Depression screen [facility] 2/9 06/16/2019 04/08/2018 05/08/2017  Decreased Interest 0 0 1  Down, Depressed, Hopeless 3 0 1  PHQ - 2 Score 3 0 2  Altered sleeping 0 - 0  Tired, decreased energy 0 - 0  Change in appetite 0 - 0  Feeling  bad or failure about yourself  0 - 1  Trouble concentrating 3 - 1  Moving slowly or fidgety/restless 0 - 0  Suicidal thoughts 0 - 0  PHQ-9 Score 6 - 4  Difficult doing work/chores Not difficult at all - Not difficult at all    -----------------------------------------------------------------------------------------  Anxiety, generalized From 02/22/2020-I recommend she get back in touch with her grief counselor who she saw after her husband died.  I think counseling will be the most appropriate thing for her dealing with her son hopefully staying in rehab in [place].  Adjustment insomnia From 02/22/2020-Add Flexeril 10 mg nightly.  I will see her back in 1 month.  Patient Active Problem List   Diagnosis Date Noted  . Hyperglycemia 05/08/2016  . Initial Medicare annual wellness visit 02/07/2016  . Stress at home 02/07/2016  . Alcoholic ([place]) 58/52/7782  . Abdominal pain, right upper quadrant 10/12/2014  . Anxiety 10/12/2014  . 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 Essential (primary) hypertension 10/12/2014  . Clinical depression 10/12/2014  . Anxiety, generalized 10/12/2014  . Hypercholesteremia 10/12/2014  . Cannot sleep 10/12/2014  . Kidney lump 10/12/2014  . Lipoma of neck 10/12/2014  . Affective disorder, major 10/12/2014  . Awareness of heartbeats 10/12/2014  . Pancreatitis 10/12/2014  . Lump in neck 10/12/2014  . Deafness, sensorineural 10/12/2014  . Abnormal weight loss 10/12/2014  . 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 Neck mass 09/23/2013   Past Medical History:  Diagnosis Date  .  Anxiety   . Arthritis   . Chronic kidney disease    ?spot on her left kidney  . Depression   . HBP (high blood pressure)   . Heart murmur    doesn't give her any problems  . High cholesterol   . PONV (postoperative nausea and vomiting)    Social History   Tobacco Use  . Smoking status: Never Smoker  . Smokeless tobacco: Never Used  Vaping Use   . Vaping Use: Never used  Substance Use Topics  . Alcohol use: Yes    Alcohol/week: 21.0 standard drinks    Types: 21 Glasses of wine per week    Comment: 3 glasses of wine nightly  . Drug use: No   Allergies  Allergen Reactions  . Other Other (See Comments)    Anesthesia caused nausea and vomiting about 20 years ago     Medications: Outpatient Medications Prior to Visit  Medication Sig  . alendronate (FOSAMAX) 70 MG tablet TAKE 1 TABLET ONCE A WEEK  . b complex vitamins tablet Take 1 tablet by mouth daily.  . chlordiazePOXIDE (LIBRIUM) 25 MG capsule TAKE ONE CAPSULE EVERY SIX HOURS AS NEEDED (Patient not taking: Reported on 06/16/2019)  . Cholecalciferol (VITAMIN D3) 50 MCG (2000 UT) capsule Take 2,000 Units by mouth daily.  . cyclobenzaprine (FLEXERIL) 5 MG tablet Take 1 tablet (5 mg total) by mouth at bedtime.  Marland Kitchen desvenlafaxine (PRISTIQ) 100 MG 24 hr tablet Take 1 tablet (100 mg total) by mouth daily.  Marland Kitchen diltiazem (DILT-XR) 180 MG 24 hr capsule Take 2 capsules (360 mg total) by mouth daily.  Marland Kitchen loratadine (CLARITIN) 10 MG tablet Take 10 mg by mouth daily.  . rosuvastatin (CRESTOR) 10 MG tablet TAKE ONE TABLET EVERY DAY  . valACYclovir (VALTREX) 500 MG tablet TAKE 1 TABLET BY MOUTH DAILY (Patient taking differently: No sig reported)   No facility-administered medications prior to visit.    Review of Systems  Constitutional: Negative for appetite change, chills, fatigue and fever.  Respiratory: Negative.  Negative for chest tightness and shortness of breath.   Cardiovascular: Negative.  Negative for chest pain and palpitations.  Gastrointestinal: Negative.  Negative for abdominal pain, nausea and vomiting.  Neurological: Negative for dizziness, weakness, light-headedness and headaches.  Psychiatric/Behavioral: Positive for dysphoric mood. Negative for decreased concentration, self-injury, sleep disturbance and suicidal ideas. The patient is nervous/anxious.       Objective     BP (!) 149/85 (BP Location: Right Arm, Patient Position: Sitting, Cuff Size: Large)   Pulse 83   Temp 99.1 F (37.3 C) (Oral)   Wt 139 lb (63 kg)   BMI 24.62 kg/m    Physical Exam Vitals reviewed.  Constitutional:      Appearance: She is well-developed.  HENT:     Head: Normocephalic and atraumatic.     Right Ear: External ear normal.     Left Ear: External ear normal.     Nose: Nose normal.  Eyes:     Conjunctiva/sclera: Conjunctivae normal.  Neck:     Thyroid: No thyromegaly.  Cardiovascular:     Rate and Rhythm: Normal rate and regular rhythm.     Heart sounds: Normal heart sounds.  Pulmonary:     Effort: Pulmonary effort is normal.     Breath sounds: Normal breath sounds.  Abdominal:     Palpations: Abdomen is soft.  Skin:    General: Skin is warm and dry.  Neurological:     General: No focal  deficit present.     Mental Status: She is alert and oriented to person, place, and time.  Psychiatric:        Mood and Affect: Mood normal.        Behavior: Behavior normal.        Thought Content: Thought content normal.        Judgment: Judgment normal.       No results found for any visits on 03/29/20.  Assessment & Plan     1. Essential (primary) hypertension Controlled on present medications.  Follow-up 6 months. Home blood pressure readings are 120s over 74-80 2. Recurrent major depressive disorder, in partial remission ([place]) Clinically doing very well.  Continue Pristiq 100 mg daily indefinitely.  3. Hypercholesteremia Follow-up at time of physical.   No follow-ups on file.         Jonathan Kirkendoll Cranford Mon, MD  [facility] [phone] (phone) [phone] (fax)  [place]

## 2020-06-16 ENCOUNTER — Other Ambulatory Visit: Payer: Self-pay | Admitting: Family Medicine

## 2020-06-16 DIAGNOSIS — I10 Essential (primary) hypertension: Secondary | ICD-10-CM

## 2020-06-18 ENCOUNTER — Other Ambulatory Visit: Payer: Self-pay | Admitting: Family Medicine

## 2020-07-25 ENCOUNTER — Telehealth: Payer: Self-pay

## 2020-07-25 MED ORDER — TRAZODONE HCL 50 MG PO TABS: 50.0000 mg | ORAL_TABLET | Freq: Every day | ORAL | 0 refills | Status: DC

## 2020-07-25 NOTE — Telephone Encounter (Signed)
50 mg at bedtime

## 2020-07-25 NOTE — Telephone Encounter (Signed)
Rx sent to pharmacy   

## 2020-07-25 NOTE — Addendum Note (Signed)
Addended by: Julieta Bellini on: 07/25/2020 10:47 AM   Modules accepted: Orders

## 2020-07-25 NOTE — Telephone Encounter (Signed)
Patient is under increased stress and has asked for a prescription Trazodone to help her sleep.  She is unable to fall asleep or stay asleep

## 2020-08-28 DIAGNOSIS — H903 Sensorineural hearing loss, bilateral: Secondary | ICD-10-CM | POA: Diagnosis not present

## 2020-09-09 ENCOUNTER — Other Ambulatory Visit: Payer: Self-pay | Admitting: Family Medicine

## 2020-09-09 DIAGNOSIS — I10 Essential (primary) hypertension: Secondary | ICD-10-CM

## 2020-09-09 NOTE — Telephone Encounter (Signed)
Requested medication (s) are due for refill today: yes  Requested medication (s) are on the active medication list: yes  Last refill:  06/18/20  Future visit scheduled: no  Notes to clinic:  overdue lab work   Requested Prescriptions  Pending Prescriptions Disp Refills   desvenlafaxine (PRISTIQ) 100 MG 24 hr tablet [Pharmacy Med Name: DESVENLAFAXINE SUCCNT ER 100MG ] 90 tablet 0    Sig: TAKE ONE TABLET BY MOUTH DAILY      Psychiatry: Antidepressants - SNRI - desvenlafaxine & venlafaxine Failed - 09/09/2020 11:59 AM      Failed - LDL in normal range and within 360 days    LDL Chol Calc (NIH)  Date Value Ref Range Status  05/05/2019 80 0 - 99 mg/dL Final          Failed - Total Cholesterol in normal range and within 360 days    Cholesterol, Total  Date Value Ref Range Status  05/05/2019 167 100 - 199 mg/dL Final          Failed - Triglycerides in normal range and within 360 days    Triglycerides  Date Value Ref Range Status  05/05/2019 59 0 - 149 mg/dL Final          Failed - Last BP in normal range    BP Readings from Last 1 Encounters:  03/29/20 (!) 149/85          Passed - Completed PHQ-2 or PHQ-9 in the last 360 days      Passed - Valid encounter within last 6 months    Recent Outpatient Visits           5 months ago Essential (primary) hypertension   [facility] Jerrol Banana., MD   6 months ago Recurrent major depressive disorder, in partial remission [facility])   [facility] Jerrol Banana., MD   11 months ago Acute low back pain, unspecified back pain laterality, unspecified whether sciatica present   [place], Adriana M, PA-C   1 year ago Recurrent major depressive disorder, in partial remission [facility])   [facility] Jerrol Banana., MD   2 years ago Encounter for annual physical examination excluding gynecological examination in a patient older than 17 years    [facility] Rosanna Randy, Retia Passe., MD

## 2020-09-09 NOTE — Telephone Encounter (Signed)
Requested Prescriptions  Pending Prescriptions Disp Refills  . DILT-XR 180 MG 24 hr capsule [Pharmacy Med Name: DILT-XR 180 MG CAP] 180 capsule 0    Sig: TAKE 2 CAPSULES BY MOUTH EVERY DAY     Cardiovascular:  Calcium Channel Blockers Failed - 09/09/2020 12:58 PM      Failed - Last BP in normal range    BP Readings from Last 1 Encounters:  03/29/20 (!) 149/85         Passed - Valid encounter within last 6 months    Recent Outpatient Visits          5 months ago Essential (primary) hypertension   [facility] Jerrol Banana., MD   6 months ago Recurrent major depressive disorder, in partial remission [facility])   [facility] Jerrol Banana., MD   11 months ago Acute low back pain, unspecified back pain laterality, unspecified whether sciatica present   [facility], Fabio Bering M, PA-C   1 year ago Recurrent major depressive disorder, in partial remission [facility])   [facility] Jerrol Banana., MD   2 years ago Encounter for annual physical examination excluding gynecological examination in a patient older than 17 years   [facility] Jerrol Banana., MD

## 2020-09-13 ENCOUNTER — Other Ambulatory Visit: Payer: Self-pay | Admitting: Family Medicine

## 2020-09-13 DIAGNOSIS — Z1231 Encounter for screening mammogram for malignant neoplasm of breast: Secondary | ICD-10-CM

## 2020-09-19 ENCOUNTER — Ambulatory Visit
Admission: RE | Admit: 2020-09-19 | Discharge: 2020-09-19 | Disposition: A | Discharge status: Home / Self Care | Payer: PPO | Source: Ambulatory Visit | Attending: Family Medicine | Admitting: Family Medicine

## 2020-09-19 ENCOUNTER — Other Ambulatory Visit: Payer: Self-pay

## 2020-09-19 DIAGNOSIS — Z1231 Encounter for screening mammogram for malignant neoplasm of breast: Secondary | ICD-10-CM | POA: Diagnosis not present

## 2020-09-27 ENCOUNTER — Encounter: Payer: PPO | Admitting: Family Medicine

## 2020-09-30 ENCOUNTER — Other Ambulatory Visit: Payer: Self-pay | Admitting: Family Medicine

## 2020-09-30 NOTE — Telephone Encounter (Signed)
Requested Prescriptions  Pending Prescriptions Disp Refills  . traZODone (DESYREL) 50 MG tablet [Pharmacy Med Name: traZODone 50 MG TABLET] 30 tablet 0    Sig: TAKE ONE TABLET BY MOUTH EVERY NIGHT AT BEDTIME     Psychiatry: Antidepressants - Serotonin Modulator Failed - 09/30/2020 11:12 AM      Failed - Valid encounter within last 6 months    Recent Outpatient Visits          6 months ago Essential (primary) hypertension   [facility] Jerrol Banana., MD   7 months ago Recurrent major depressive disorder, in partial remission [facility])   [facility] Jerrol Banana., MD   1 year ago Acute low back pain, unspecified back pain laterality, unspecified whether sciatica present   [facility], PA-C   1 year ago Recurrent major depressive disorder, in partial remission [facility])   [facility] Jerrol Banana., MD   2 years ago Encounter for annual physical examination excluding gynecological examination in a patient older than 17 years   [facility] Rosanna Randy, Retia Passe., MD             Passed - Completed PHQ-2 or PHQ-9 in the last 360 days

## 2020-11-01 ENCOUNTER — Other Ambulatory Visit: Payer: Self-pay | Admitting: Family Medicine

## 2020-11-01 NOTE — Telephone Encounter (Signed)
Requested medication (s) are due for refill today:   Yes need new rx   It's been 3 yrs since this was filled  Requested medication (s) are on the active medication list:   Yes  Future visit scheduled:   No   Last ordered: 03/31/2017  #90, 3 refills  Returned because it's an old rx from 2018.     Requested Prescriptions  Pending Prescriptions Disp Refills   valACYclovir (VALTREX) 500 MG tablet [Pharmacy Med Name: VALACYCLOVIR HCL 500 MG TAB] 90 tablet 3    Sig: TAKE ONE TABLET EVERY DAY      Antimicrobials:  Antiviral Agents - Anti-Herpetic Passed - 11/01/2020 12:10 PM      Passed - Valid encounter within last 12 months    Recent Outpatient Visits           7 months ago Essential (primary) hypertension   [facility] Jerrol Banana., MD   8 months ago Recurrent major depressive disorder, in partial remission [facility])   [facility] Jerrol Banana., MD   1 year ago Acute low back pain, unspecified back pain laterality, unspecified whether sciatica present   [place], PA-C   1 year ago Recurrent major depressive disorder, in partial remission [facility])   [facility] Jerrol Banana., MD   2 years ago Encounter for annual physical examination excluding gynecological examination in a patient older than 17 years   [facility] Jerrol Banana., MD

## 2020-11-06 ENCOUNTER — Telehealth: Payer: Self-pay

## 2020-11-06 NOTE — Telephone Encounter (Signed)
Please advise. Thanks.  

## 2020-11-06 NOTE — Telephone Encounter (Signed)
Copied from [place] [phone]. Topic: General - Other >> Nov 06, 2020  2:55 PM Pawlus, Brayton Layman A wrote: Reason for CRM: Pt had some questions about getting a Colonoscopy done since she just turned 46, pt wanted some further information regarding where and when to get this done.

## 2020-11-21 ENCOUNTER — Other Ambulatory Visit: Payer: Self-pay | Admitting: *Deleted

## 2020-11-21 DIAGNOSIS — Z1211 Encounter for screening for malignant neoplasm of colon: Secondary | ICD-10-CM

## 2020-11-21 NOTE — Telephone Encounter (Signed)
Referral ordered

## 2020-12-11 ENCOUNTER — Other Ambulatory Visit: Payer: Self-pay | Admitting: Family Medicine

## 2020-12-11 DIAGNOSIS — I10 Essential (primary) hypertension: Secondary | ICD-10-CM

## 2020-12-11 NOTE — Telephone Encounter (Signed)
Courtesy refill provided. Pt has appt 12/14/20

## 2020-12-11 NOTE — Telephone Encounter (Signed)
Requested medications are due for refill today yes  Requested medications are on the active medication list yes  Last refill 5/16  Last visit 03/2020  Future visit scheduled 12/14/20  Notes to clinic Failed protocol due to no valid visit within 6  months, has appt later this week.

## 2020-12-12 ENCOUNTER — Other Ambulatory Visit: Payer: Self-pay | Admitting: Family Medicine

## 2020-12-12 DIAGNOSIS — I10 Essential (primary) hypertension: Secondary | ICD-10-CM

## 2020-12-12 NOTE — Telephone Encounter (Signed)
Requested medication (s) are due for refill today: yes   Requested medication (s) are on the active medication list: yes   Last refill:  09/11/2020  Future visit scheduled:  yes   Notes to clinic:  Patient has upcoming appt on 12/14/2020   Requested Prescriptions  Pending Prescriptions Disp Refills   desvenlafaxine (PRISTIQ) 100 MG 24 hr tablet [Pharmacy Med Name: DESVENLAFAXINE SUCCNT ER '100MG'$ ] 90 tablet 0    Sig: TAKE ONE TABLET BY MOUTH DAILY     Psychiatry: Antidepressants - SNRI - desvenlafaxine & venlafaxine Failed - 12/12/2020 10:11 AM      Failed - LDL in normal range and within 360 days    LDL Chol Calc (NIH)  Date Value Ref Range Status  05/05/2019 80 0 - 99 mg/dL Final          Failed - Total Cholesterol in normal range and within 360 days    Cholesterol, Total  Date Value Ref Range Status  05/05/2019 167 100 - 199 mg/dL Final          Failed - Triglycerides in normal range and within 360 days    Triglycerides  Date Value Ref Range Status  05/05/2019 59 0 - 149 mg/dL Final          Failed - Last BP in normal range    BP Readings from Last 1 Encounters:  03/29/20 (!) 149/85          Failed - Valid encounter within last 6 months    Recent Outpatient Visits           8 months ago Essential (primary) hypertension   [facility] Jerrol Banana., MD   9 months ago Recurrent major depressive disorder, in partial remission [facility])   [facility] Jerrol Banana., MD   1 year ago Acute low back pain, unspecified back pain laterality, unspecified whether sciatica present   [place], Adriana M, PA-C   1 year ago Recurrent major depressive disorder, in partial remission [facility])   [facility] Jerrol Banana., MD   2 years ago Encounter for annual physical examination excluding gynecological examination in a patient older than 17 years   [facility] Rosanna Randy, Retia Passe., MD              Passed - Completed PHQ-2 or PHQ-9 in the last 360 days

## 2020-12-14 ENCOUNTER — Encounter: Payer: PPO | Admitting: Family Medicine

## 2021-01-04 DIAGNOSIS — Z8719 Personal history of other diseases of the digestive system: Secondary | ICD-10-CM | POA: Diagnosis not present

## 2021-01-04 DIAGNOSIS — R1310 Dysphagia, unspecified: Secondary | ICD-10-CM | POA: Diagnosis not present

## 2021-01-04 DIAGNOSIS — Z1211 Encounter for screening for malignant neoplasm of colon: Secondary | ICD-10-CM | POA: Diagnosis not present

## 2021-01-04 DIAGNOSIS — K6389 Other specified diseases of intestine: Secondary | ICD-10-CM | POA: Diagnosis not present

## 2021-01-04 DIAGNOSIS — K5730 Diverticulosis of large intestine without perforation or abscess without bleeding: Secondary | ICD-10-CM | POA: Diagnosis not present

## 2021-01-04 DIAGNOSIS — K2950 Unspecified chronic gastritis without bleeding: Secondary | ICD-10-CM | POA: Diagnosis not present

## 2021-01-04 LAB — HM COLONOSCOPY

## 2021-01-06 ENCOUNTER — Other Ambulatory Visit: Payer: Self-pay | Admitting: Family Medicine

## 2021-02-13 DIAGNOSIS — H2513 Age-related nuclear cataract, bilateral: Secondary | ICD-10-CM | POA: Diagnosis not present

## 2021-03-12 ENCOUNTER — Other Ambulatory Visit: Payer: Self-pay | Admitting: Family Medicine

## 2021-03-12 DIAGNOSIS — I10 Essential (primary) hypertension: Secondary | ICD-10-CM

## 2021-03-12 NOTE — Telephone Encounter (Signed)
Requested medications are due for refill today.  yes  Requested medications are on the active medications list.  yes  Last refill. 12/12/2020  Future visit scheduled.   yes  Notes to clinic.  Pt is more than 3 months overdue. Courtesy already given.

## 2021-03-12 NOTE — Telephone Encounter (Signed)
Requested medication (s) are due for refill today: Yes  Requested medication (s) are on the active medication list: Yes  Last refill:  12/13/20  Future visit scheduled: Yes  Notes to clinic:  Protocol indicates pt. Needs lab work.    Requested Prescriptions  Pending Prescriptions Disp Refills   desvenlafaxine (PRISTIQ) 100 MG 24 hr tablet [Pharmacy Med Name: DESVENLAFAXINE SUCCNT ER 100MG ] 90 tablet 0    Sig: TAKE ONE TABLET BY MOUTH DAILY     Psychiatry: Antidepressants - SNRI - desvenlafaxine & venlafaxine Failed - 03/12/2021  9:46 AM      Failed - LDL in normal range and within 360 days    LDL Chol Calc (NIH)  Date Value Ref Range Status  05/05/2019 80 0 - 99 mg/dL Final          Failed - Total Cholesterol in normal range and within 360 days    Cholesterol, Total  Date Value Ref Range Status  05/05/2019 167 100 - 199 mg/dL Final          Failed - Triglycerides in normal range and within 360 days    Triglycerides  Date Value Ref Range Status  05/05/2019 59 0 - 149 mg/dL Final          Failed - Last BP in normal range    BP Readings from Last 1 Encounters:  03/29/20 (!) 149/85          Failed - Valid encounter within last 6 months    Recent Outpatient Visits           11 months ago Essential (primary) hypertension   [facility] Jerrol Banana., MD   1 year ago Recurrent major depressive disorder, in partial remission [facility])   [facility] Jerrol Banana., MD   1 year ago Acute low back pain, unspecified back pain laterality, unspecified whether sciatica present   [place], Adriana M, PA-C   1 year ago Recurrent major depressive disorder, in partial remission [facility])   [facility] Jerrol Banana., MD   2 years ago Encounter for annual physical examination excluding gynecological examination in a patient older than 17 years   [facility] Rosanna Randy, Retia Passe., MD              Passed - Completed PHQ-2 or PHQ-9 in the last 360 days

## 2021-03-14 DIAGNOSIS — L812 Freckles: Secondary | ICD-10-CM | POA: Diagnosis not present

## 2021-03-14 DIAGNOSIS — D225 Melanocytic nevi of trunk: Secondary | ICD-10-CM | POA: Diagnosis not present

## 2021-03-14 DIAGNOSIS — I788 Other diseases of capillaries: Secondary | ICD-10-CM | POA: Diagnosis not present

## 2021-03-14 DIAGNOSIS — L820 Inflamed seborrheic keratosis: Secondary | ICD-10-CM | POA: Diagnosis not present

## 2021-03-14 DIAGNOSIS — L821 Other seborrheic keratosis: Secondary | ICD-10-CM | POA: Diagnosis not present

## 2021-03-14 DIAGNOSIS — L720 Epidermal cyst: Secondary | ICD-10-CM | POA: Diagnosis not present

## 2021-03-16 DIAGNOSIS — M65331 Trigger finger, right middle finger: Secondary | ICD-10-CM | POA: Diagnosis not present

## 2021-03-16 DIAGNOSIS — M19049 Primary osteoarthritis, unspecified hand: Secondary | ICD-10-CM | POA: Diagnosis not present

## 2021-04-25 ENCOUNTER — Encounter: Payer: Self-pay | Admitting: Family Medicine

## 2021-04-25 ENCOUNTER — Other Ambulatory Visit: Payer: Self-pay

## 2021-04-25 ENCOUNTER — Ambulatory Visit (INDEPENDENT_AMBULATORY_CARE_PROVIDER_SITE_OTHER): Payer: PPO | Admitting: Family Medicine

## 2021-04-25 ENCOUNTER — Encounter: Payer: PPO | Admitting: Family Medicine

## 2021-04-25 VITALS — BP 131/76 | HR 82 | Temp 98.6°F | Resp 16 | Ht 63.0 in | Wt 135.0 lb

## 2021-04-25 DIAGNOSIS — F439 Reaction to severe stress, unspecified: Secondary | ICD-10-CM

## 2021-04-25 DIAGNOSIS — E7800 Pure hypercholesterolemia, unspecified: Secondary | ICD-10-CM | POA: Diagnosis not present

## 2021-04-25 DIAGNOSIS — I10 Essential (primary) hypertension: Secondary | ICD-10-CM | POA: Diagnosis not present

## 2021-04-25 DIAGNOSIS — F411 Generalized anxiety disorder: Secondary | ICD-10-CM | POA: Diagnosis not present

## 2021-04-25 DIAGNOSIS — F1020 Alcohol dependence, uncomplicated: Secondary | ICD-10-CM

## 2021-04-25 DIAGNOSIS — Z0000 Encounter for general adult medical examination without abnormal findings: Secondary | ICD-10-CM | POA: Diagnosis not present

## 2021-04-25 DIAGNOSIS — F3341 Major depressive disorder, recurrent, in partial remission: Secondary | ICD-10-CM | POA: Diagnosis not present

## 2021-04-25 DIAGNOSIS — M8580 Other specified disorders of bone density and structure, unspecified site: Secondary | ICD-10-CM | POA: Diagnosis not present

## 2021-04-25 NOTE — Progress Notes (Signed)
BP 131/76 (BP Location: Left Arm, Patient Position: Sitting, Cuff Size: Normal)    Pulse 82    Temp 98.6 F (37 C) (Temporal)    Resp 16    Ht 5\' 3"  (1.6 m)    Wt 135 lb (61.2 kg)    SpO2 96%    BMI 23.91 kg/m    Subjective:    Patient ID: Teresa Duarte, female    DOB: [birth year], 70 y.o.   MRN: [id number]  HPI: Teresa Duarte is a 70 y.o. female presenting on 04/25/2021 for comprehensive medical examination. Current medical complaints include:none  Hypertension: - Medications: diltiazem - Compliance: good - Checking BP at home: sometimes, SBP 120-130s - Denies any LE edema, medication SEs, or symptoms of hypotension  HLD - medications: crestor - compliance: good - medication SEs: none  Insomnia - trazodone and melatonin nightly. No grogginess. Wakes feeling refreshed.   Osteopenia - previously on fosamax. Last DEXA 06/2019 with osteopenia with T score -1.8. Taking vit D.   Genital herpes - No outbreaks recently. On valtrex 250mg  daily.  Alcohol use - 3 glasses of wine per night. Denies previous withdrawal.   Anxiety - Medications: pristiq - Taking: good compliance - Counseling: none currently - Previous hospitalizations: many years ago.  - Symptoms: none - Current stressors: recent move, husband passed away a year ago. Son living with her and battling addiction.  - Coping Mechanisms: staying busy  She currently lives with: son Menopausal Symptoms: no  Depression Screen done today and results listed below:  Depression screen [facility] 2/9 04/25/2021 03/29/2020 06/16/2019 04/08/2018 05/08/2017  Decreased Interest 1 0 0 0 1  Down, Depressed, Hopeless 1 0 3 0 1  PHQ - 2 Score 2 0 3 0 2  Altered sleeping 0 0 0 - 0  Tired, decreased energy 0 0 0 - 0  Change in appetite 2 0 0 - 0  Feeling bad or failure about yourself  0 0 0 - 1  Trouble concentrating 0 3 3 - 1  Moving slowly or fidgety/restless 0 0 0 - 0  Suicidal thoughts 0 0 0 - 0  PHQ-9 Score 4 3 6  - 4  Difficult doing  work/chores Somewhat difficult Not difficult at all Not difficult at all - Not difficult at all    The patient does not have a history of falls. I did not complete a risk assessment for falls. A plan of care for falls was not documented.   Past Medical History:  Past Medical History:  Diagnosis Date   Anxiety    Arthritis    Chronic kidney disease    ?spot on her left kidney   Depression    HBP (high blood pressure)    Heart murmur    doesn't give her any problems   High cholesterol    PONV (postoperative nausea and vomiting)     Surgical History:  Past Surgical History:  Procedure Laterality Date   ABDOMINAL HYSTERECTOMY     CHOLECYSTECTOMY N/A 03/03/2014   Procedure: LAPAROSCOPIC CHOLECYSTECTOMY WITH INTRAOPERATIVE CHOLANGIOGRAM;  Surgeon: Coralie Keens, MD;  Location: [place];  Service: General;  Laterality: N/A;   COLONOSCOPY  2013   FOOT SURGERY Bilateral    SHOULDER SURGERY Right    TENDON REPAIR Left    tendon/ligament repair, pt not sure which   TONSILLECTOMY      Medications:  Current Outpatient Medications on File Prior to Visit  Medication Sig   b complex vitamins tablet Take 1 tablet  by mouth daily.   Cholecalciferol (VITAMIN D3) 50 MCG (2000 UT) capsule Take 2,000 Units by mouth daily.   desvenlafaxine (PRISTIQ) 100 MG 24 hr tablet TAKE ONE TABLET BY MOUTH DAILY   DILT-XR 180 MG 24 hr capsule TAKE 2 CAPSULES BY MOUTH EVERY DAY   loratadine (CLARITIN) 10 MG tablet Take 10 mg by mouth daily.   meloxicam (MOBIC) 7.5 MG tablet Mobic 7.5 mg tablet  Take 1 tablet twice a day by oral route.   omeprazole (PRILOSEC) 40 MG capsule TAKE 1 CAPSULE EVERY DAY BEFORE BREAKFAST   rosuvastatin (CRESTOR) 10 MG tablet TAKE ONE TABLET EVERY DAY   traZODone (DESYREL) 50 MG tablet TAKE ONE TABLET BY MOUTH EVERY NIGHT AT BEDTIME   valACYclovir (VALTREX) 500 MG tablet As needed   No current facility-administered medications on file prior to visit.    Allergies:  Allergies   Allergen Reactions   Other Other (See Comments)    Anesthesia caused nausea and vomiting about 20 years ago    Social History:  Social History   Socioeconomic History   Marital status: Widowed    Spouse name: Not on file   Number of children: 2   Years of education: Not on file   Highest education level: High school graduate  Occupational History   Occupation: retired  Tobacco Use   Smoking status: Never   Smokeless tobacco: Never  Vaping Use   Vaping Use: Never used  Substance and Sexual Activity   Alcohol use: Yes    Alcohol/week: 21.0 standard drinks    Types: 21 Glasses of wine per week    Comment: 3 glasses of wine nightly   Drug use: No   Sexual activity: Yes    Partners: Male    Comment: married, husband has a hx of liver failurem alcohol and drug use with Hep C  Other Topics Concern   Not on file  Social History Narrative   Not on file   Social Determinants of Health   Financial Resource Strain: Not on file  Food Insecurity: Not on file  Transportation Needs: Not on file  Physical Activity: Not on file  Stress: Not on file  Social Connections: Not on file  Intimate Partner Violence: Not on file   Social History   Tobacco Use  Smoking Status Never  Smokeless Tobacco Never   Social History   Substance and Sexual Activity  Alcohol Use Yes   Alcohol/week: 21.0 standard drinks   Types: 21 Glasses of wine per week   Comment: 3 glasses of wine nightly    Family History:  Family History  Problem Relation Age of Onset   Hypertension Mother    Hypercholesterolemia Mother    Heart attack Father    Depression Sister    Depression Sister    Hypertension Sister    Hypercholesterolemia Sister    Depression Son    Anxiety disorder Son    Breast cancer Neg Hx     Past medical history, surgical history, medications, allergies, family history and social history reviewed with patient today and changes made to appropriate areas of the chart.       Objective:    BP 131/76 (BP Location: Left Arm, Patient Position: Sitting, Cuff Size: Normal)    Pulse 82    Temp 98.6 F (37 C) (Temporal)    Resp 16    Ht 5\' 3"  (1.6 m)    Wt 135 lb (61.2 kg)    SpO2 96%  BMI 23.91 kg/m   Wt Readings from Last 3 Encounters:  04/25/21 135 lb (61.2 kg)  03/29/20 139 lb (63 kg)  09/16/19 138 lb (62.6 kg)    Physical Exam Vitals reviewed.  Constitutional:      Appearance: Normal appearance.  HENT:     Head: Normocephalic.     Right Ear: External ear normal.     Left Ear: External ear normal.     Nose: Nose normal.  Eyes:     Extraocular Movements: Extraocular movements intact.  Cardiovascular:     Rate and Rhythm: Regular rhythm.     Heart sounds: Normal heart sounds. No murmur heard. Pulmonary:     Effort: Pulmonary effort is normal. No respiratory distress.     Breath sounds: Normal breath sounds.  Abdominal:     General: Bowel sounds are normal.     Palpations: Abdomen is soft.     Tenderness: There is no abdominal tenderness.  Musculoskeletal:        General: Normal range of motion.     Right lower leg: No edema.     Left lower leg: No edema.  Skin:    General: Skin is warm and dry.  Neurological:     Mental Status: She is alert and oriented to person, place, and time. Mental status is at baseline.     Gait: Gait normal.  Psychiatric:        Behavior: Behavior normal.    Results for orders placed or performed in visit on 01/15/21  HM COLONOSCOPY  Result Value Ref Range   HM Colonoscopy See Report (in chart) See Report (in chart), Patient Reported      Assessment & Plan:   Problem List Items Addressed This Visit       Cardiovascular and Mediastinum   Essential (primary) hypertension    Doing well on current regimen, no changes made today. Obtaining labs.      Relevant Orders   Comprehensive metabolic panel   Lipid panel     Other   Alcoholic ([place])    Encouraged safe consumption given age.      Relevant Orders    Comprehensive metabolic panel   CBC   Anxiety, generalized    Chronic. Overall doing well on current regimen with recent grief from husband's passing. Discussed counseling.      Clinical depression    Chronic. Overall doing well on current regimen with recent grief from husband's passing. Discussed counseling.      Hypercholesteremia    Recheck labs and adjust as indicated.      Stress at home   Other Visit Diagnoses     Annual physical exam    -  Primary   Relevant Orders   Comprehensive metabolic panel   Lipid panel   CBC        Follow up plan: Return in about 1 year (around 04/25/2022) for cpe.   LABORATORY TESTING:  - Pap smear: not applicable  IMMUNIZATIONS:   - Tdap: Tetanus vaccination status reviewed: due. - Influenza: Up to date - Pneumococcal: Up to date - HPV: Not applicable - Shingrix vaccine: Refused - COVID vaccine: has received 2 doses of mRNA vaccine  SCREENING: - Mammogram: Up to date  - Colonoscopy: Up to date  - Bone Density: Up to date  - Lung Cancer Screening: Not applicable   Hep C Screening: UTD Menstrual History/LMP/Abnormal Bleeding: post menopausal  Osteoporosis: Discussed high calcium and vitamin D supplementation, weight bearing exercises  Advanced Care Planning: A voluntary discussion about advance care planning including the explanation and discussion of advance directives.  Discussed health care proxy and Living will, and the patient was able to identify a health care proxy as son, Ailene Rud.  Patient does have a living will at present time. If patient does have living will, I have requested they bring this to the clinic to be scanned in to their chart.  PATIENT COUNSELING:   Advised to take 1 mg of folate supplement per day if capable of pregnancy.   Sexuality: Discussed sexually transmitted diseases, partner selection, use of condoms, avoidance of unintended pregnancy  and contraceptive alternatives.   Advised to  avoid cigarette smoking.  I discussed with the patient that most people either abstain from alcohol or drink within safe limits (<=14/week and <=4 drinks/occasion for males, <=7/weeks and <= 3 drinks/occasion for females) and that the risk for alcohol disorders and other health effects rises proportionally with the number of drinks per week and how often a drinker exceeds daily limits.  Discussed cessation/primary prevention of drug use and availability of treatment for abuse.   Diet: Encouraged to adjust caloric intake to maintain  or achieve ideal body weight, to reduce intake of dietary saturated fat and total fat, to limit sodium intake by avoiding high sodium foods and not adding table salt, and to maintain adequate dietary potassium and calcium preferably from fresh fruits, vegetables, and low-fat dairy products.    Stressed the importance of regular exercise  Injury prevention: Discussed safety belts, safety helmets, smoke detector, smoking near bedding or upholstery.   Dental health: Discussed importance of regular tooth brushing, flossing, and dental visits.    NEXT PREVENTATIVE PHYSICAL DUE IN 1 YEAR. Return in about 1 year (around 04/25/2022) for cpe.

## 2021-04-25 NOTE — Assessment & Plan Note (Signed)
Chronic. Overall doing well on current regimen with recent grief from husband's passing. Discussed counseling.

## 2021-04-25 NOTE — Assessment & Plan Note (Signed)
Encouraged adequate vit D and calcium supplementation.

## 2021-04-25 NOTE — Assessment & Plan Note (Signed)
Recheck labs and adjust as indicated.  

## 2021-04-25 NOTE — Patient Instructions (Signed)
It was great to see you!  Our plans for today:  - Check with your pharmacy about your tetanus booster.    We are checking some labs today, we will release these results to your MyChart.  Take care and seek immediate care sooner if you develop any concerns.   Dr. Ky Barban  Things to do to keep yourself healthy  - Exercise at least 30-45 minutes a day, 3-4 days a week.  - Eat a low-fat diet with lots of fruits and vegetables, up to 7-9 servings per day.  - Seatbelts can save your life. Wear them always.  - Smoke detectors on every level of your home, check batteries every year.  - Eye Doctor - have an eye exam every 1-2 years  - Safe sex - if you may be exposed to STDs, use a condom.  - Alcohol -  If you drink, do it moderately, less than 2 drinks per day.  - [place]. Choose someone to speak for you if you are not able. https://www.prepareforyourcare.org is a great website to help you navigate this. - Depression is common in our stressful world.If you're feeling down or losing interest in things you normally enjoy, please come in for a visit.  - Violence - If anyone is threatening or hurting you, please call immediately.

## 2021-04-25 NOTE — Assessment & Plan Note (Signed)
Doing well on current regimen, no changes made today. Obtaining labs. 

## 2021-04-25 NOTE — Assessment & Plan Note (Signed)
Encouraged safe consumption given age.

## 2021-04-26 LAB — COMPREHENSIVE METABOLIC PANEL
ALT: 15 IU/L (ref 0–32)
AST: 26 IU/L (ref 0–40)
Albumin/Globulin Ratio: 2.6 — ABNORMAL HIGH (ref 1.2–2.2)
Albumin: 4.9 g/dL — ABNORMAL HIGH (ref 3.8–4.8)
Alkaline Phosphatase: 82 IU/L (ref 44–121)
BUN/Creatinine Ratio: 19 (ref 12–28)
BUN: 16 mg/dL (ref 8–27)
Bilirubin Total: 0.4 mg/dL (ref 0.0–1.2)
CO2: 25 mmol/L (ref 20–29)
Calcium: 9.8 mg/dL (ref 8.7–10.3)
Chloride: 101 mmol/L (ref 96–106)
Creatinine, Ser: 0.84 mg/dL (ref 0.57–1.00)
Globulin, Total: 1.9 g/dL (ref 1.5–4.5)
Glucose: 107 mg/dL — ABNORMAL HIGH (ref 70–99)
Potassium: 4.7 mmol/L (ref 3.5–5.2)
Sodium: 140 mmol/L (ref 134–144)
Total Protein: 6.8 g/dL (ref 6.0–8.5)
eGFR: 75 mL/min/{1.73_m2} (ref >59)

## 2021-04-26 LAB — LIPID PANEL
Chol/HDL Ratio: 2.7 ratio (ref 0.0–4.4)
Cholesterol, Total: 217 mg/dL — ABNORMAL HIGH (ref 100–199)
HDL: 79 mg/dL (ref >39)
LDL Chol Calc (NIH): 122 mg/dL — ABNORMAL HIGH (ref 0–99)
Triglycerides: 93 mg/dL (ref 0–149)
VLDL Cholesterol Cal: 16 mg/dL (ref 5–40)

## 2021-04-26 LAB — CBC
Hematocrit: 39.7 % (ref 34.0–46.6)
Hemoglobin: 13.3 g/dL (ref 11.1–15.9)
MCH: 32 pg (ref 26.6–33.0)
MCHC: 33.5 g/dL (ref 31.5–35.7)
MCV: 96 fL (ref 79–97)
Platelets: 220 10*3/uL (ref 150–450)
RBC: 4.15 x10E6/uL (ref 3.77–5.28)
RDW: 12.4 % (ref 11.7–15.4)
WBC: 5.3 10*3/uL (ref 3.4–10.8)

## 2021-05-11 ENCOUNTER — Other Ambulatory Visit: Payer: Self-pay | Admitting: Family Medicine

## 2021-05-11 DIAGNOSIS — E7800 Pure hypercholesterolemia, unspecified: Secondary | ICD-10-CM

## 2021-06-04 ENCOUNTER — Other Ambulatory Visit: Payer: Self-pay | Admitting: Family Medicine

## 2021-06-05 NOTE — Telephone Encounter (Signed)
Requested medications are due for refill today.  yes  Requested medications are on the active medications list.  yes  Last refill. 03/14/2021 #90 0 refills  Future visit scheduled.   yes  Notes to clinic.  Failed protocol d/t abnormal labs.    Requested Prescriptions  Pending Prescriptions Disp Refills   desvenlafaxine (PRISTIQ) 100 MG 24 hr tablet [Pharmacy Med Name: DESVENLAFAXINE SUCCNT ER 100MG ] 90 tablet 0    Sig: TAKE ONE TABLET BY MOUTH DAILY     Psychiatry: Antidepressants - SNRI - desvenlafaxine & venlafaxine Failed - 06/04/2021  2:09 PM      Failed - Lipid Panel in normal range within the last 12 months    Cholesterol, Total  Date Value Ref Range Status  04/25/2021 217 (H) 100 - 199 mg/dL Final   LDL Chol Calc (NIH)  Date Value Ref Range Status  04/25/2021 122 (H) 0 - 99 mg/dL Final   HDL  Date Value Ref Range Status  04/25/2021 79 >39 mg/dL Final   Triglycerides  Date Value Ref Range Status  04/25/2021 93 0 - 149 mg/dL Final         Passed - Cr in normal range and within 360 days    Creatinine, Ser  Date Value Ref Range Status  04/25/2021 0.84 0.57 - 1.00 mg/dL Final          Passed - Completed PHQ-2 or PHQ-9 in the last 360 days      Passed - Last BP in normal range    BP Readings from Last 1 Encounters:  04/25/21 131/76          Passed - Valid encounter within last 6 months    Recent Outpatient Visits           1 month ago Annual physical exam   [facility] Myles Gip, DO   1 year ago Essential (primary) hypertension   [facility] Jerrol Banana., MD   1 year ago Recurrent major depressive disorder, in partial remission [facility])   [facility] Jerrol Banana., MD   1 year ago Acute low back pain, unspecified back pain laterality, unspecified whether sciatica present   [place], [place], PA-C   2 years ago Recurrent major depressive disorder, in partial  remission [facility])   [facility] Jerrol Banana., MD

## 2021-06-11 ENCOUNTER — Telehealth: Payer: Self-pay

## 2021-06-11 NOTE — Telephone Encounter (Signed)
Please review request below for medical records requested.KW

## 2021-06-11 NOTE — Telephone Encounter (Signed)
Copied from [place] [id number]. Topic: Medical Record Request - Other >> Jun 11, 2021 12:18 PM McGill, Nelva Bush wrote: Patient Name/DOB/MRN #: Teresa Duarte 1950-08-11/MRN: [id number] Requestor Name/Agency: Kenneth/Policy holder Call Back #: [phone] Information Requested: Last two years of medical records.  Fax- [phone] Email [email or web address]

## 2021-06-12 ENCOUNTER — Telehealth: Payer: Self-pay

## 2021-06-12 NOTE — Telephone Encounter (Signed)
Please advise 

## 2021-06-12 NOTE — Telephone Encounter (Signed)
Copied from [place] [phone]. Topic: Medical Record Request - Other >> Jun 11, 2021 12:18 PM McGill, Nelva Bush wrote: Patient Name/DOB/MRN #: Teresa Duarte Jan 07, 1951/MRN: [id number] Requestor Name/Agency: [employer]/Policy holder Call Back #: [phone] Information Requested: Last two years of medical records.  Fax- [phone] Email [email or web address]

## 2021-06-12 NOTE — Telephone Encounter (Signed)
Copied from [place] [phone]. Topic: Medical Record Request - Other >> Jun 11, 2021 12:18 PM McGill, Nelva Bush wrote: Patient Name/DOB/MRN #: Teresa Duarte 01/26/1951/MRN: [id number] Requestor Name/Agency: Kenneth/Policy holder Call Back #: [phone] Information Requested: Last two years of medical records.  Fax- [phone] Email [email or web address]

## 2021-06-13 ENCOUNTER — Other Ambulatory Visit: Payer: Self-pay | Admitting: Family Medicine

## 2021-06-13 DIAGNOSIS — I10 Essential (primary) hypertension: Secondary | ICD-10-CM

## 2021-07-03 ENCOUNTER — Other Ambulatory Visit: Payer: Self-pay | Admitting: Family Medicine

## 2021-07-04 NOTE — Telephone Encounter (Signed)
Requested Prescriptions  ?Pending Prescriptions Disp Refills  ?? traZODone (DESYREL) 50 MG tablet [Pharmacy Med Name: traZODone 50 MG TABLET] 30 tablet 2  ?  Sig: TAKE ONE TABLET BY MOUTH EVERY NIGHT AT BEDTIME  ?  ? Psychiatry: Antidepressants - Serotonin Modulator Passed - 07/03/2021  2:23 PM  ?  ?  Passed - Completed PHQ-2 or PHQ-9 in the last 360 days  ?  ?  Passed - Valid encounter within last 6 months  ?  Recent Outpatient Visits   ?      ? 2 months ago Annual physical exam  ? [place], DO  ? 1 year ago Essential (primary) hypertension  ? [facility] Jerrol Banana., MD  ? 1 year ago Recurrent major depressive disorder, in partial remission ([place])  ? [facility] Jerrol Banana., MD  ? 1 year ago Acute low back pain, unspecified back pain laterality, unspecified whether sciatica present  ? [place]  ? 2 years ago Recurrent major depressive disorder, in partial remission ([place])  ? [facility] Jerrol Banana., MD  ?  ?  ? ?  ?  ?  ? ?

## 2021-07-23 ENCOUNTER — Other Ambulatory Visit: Payer: Self-pay

## 2021-07-23 DIAGNOSIS — I10 Essential (primary) hypertension: Secondary | ICD-10-CM

## 2021-08-21 ENCOUNTER — Ambulatory Visit (INDEPENDENT_AMBULATORY_CARE_PROVIDER_SITE_OTHER): Payer: PPO | Admitting: Family Medicine

## 2021-08-21 ENCOUNTER — Other Ambulatory Visit: Payer: Self-pay | Admitting: Family Medicine

## 2021-08-21 ENCOUNTER — Encounter: Payer: Self-pay | Admitting: Family Medicine

## 2021-08-21 ENCOUNTER — Ambulatory Visit
Admission: RE | Admit: 2021-08-21 | Discharge: 2021-08-21 | Disposition: A | Discharge status: Home / Self Care | Payer: PPO | Source: Ambulatory Visit | Attending: Family Medicine | Admitting: Family Medicine

## 2021-08-21 VITALS — BP 142/76 | HR 70 | Temp 98.1°F | Resp 16 | Wt 139.8 lb

## 2021-08-21 DIAGNOSIS — Z1231 Encounter for screening mammogram for malignant neoplasm of breast: Secondary | ICD-10-CM | POA: Diagnosis not present

## 2021-08-21 DIAGNOSIS — M79605 Pain in left leg: Secondary | ICD-10-CM | POA: Insufficient documentation

## 2021-08-21 DIAGNOSIS — M79662 Pain in left lower leg: Secondary | ICD-10-CM | POA: Diagnosis not present

## 2021-08-21 DIAGNOSIS — G720 Drug-induced myopathy: Secondary | ICD-10-CM

## 2021-08-21 DIAGNOSIS — E7800 Pure hypercholesterolemia, unspecified: Secondary | ICD-10-CM

## 2021-08-21 LAB — COMPREHENSIVE METABOLIC PANEL
ALT: 13 IU/L (ref 0–32)
AST: 24 IU/L (ref 0–40)
Albumin/Globulin Ratio: 2.2 (ref 1.2–2.2)
Albumin: 5 g/dL — ABNORMAL HIGH (ref 3.8–4.8)
Alkaline Phosphatase: 81 IU/L (ref 44–121)
BUN/Creatinine Ratio: 19 (ref 12–28)
BUN: 13 mg/dL (ref 8–27)
Bilirubin Total: 0.5 mg/dL (ref 0.0–1.2)
CO2: 23 mmol/L (ref 20–29)
Calcium: 9.9 mg/dL (ref 8.7–10.3)
Chloride: 100 mmol/L (ref 96–106)
Creatinine, Ser: 0.7 mg/dL (ref 0.57–1.00)
Globulin, Total: 2.3 g/dL (ref 1.5–4.5)
Glucose: 123 mg/dL — ABNORMAL HIGH (ref 70–99)
Potassium: 4.3 mmol/L (ref 3.5–5.2)
Sodium: 140 mmol/L (ref 134–144)
Total Protein: 7.3 g/dL (ref 6.0–8.5)
eGFR: 93 mL/min/{1.73_m2} (ref >59)

## 2021-08-21 LAB — D-DIMER, QUANTITATIVE: D-DIMER: 0.3 mg/L FEU (ref 0.00–0.49)

## 2021-08-21 LAB — MAGNESIUM: Magnesium: 2.3 mg/dL (ref 1.6–2.3)

## 2021-08-21 MED ORDER — EVOLOCUMAB 140 MG/ML ~~LOC~~ SOSY: 140.0000 mg | PREFILLED_SYRINGE | SUBCUTANEOUS | 11 refills | Status: DC

## 2021-08-21 NOTE — Assessment & Plan Note (Signed)
Acute complaint, undiagnosed ?Pain is waxing/waning ?Report of pain 3/10 ?Describes as a "cramping" ?Area of pain is noted with slight purple discoloration ?No redness, no swelling, RLE was actually 0.5 cm larger than LLE ?No elevated temperature, no change in pulses ?Will send for [place] to r/o DVT ?Has been on Statin for 5 years ?Will check CMP, Mg, D-dimer for abnormalities ?RTC if needed  ?

## 2021-08-21 NOTE — Progress Notes (Signed)
?  ?[employer] as a Education administrator for Gwyneth Sprout, FNP.,have documented all relevant documentation on the behalf of Gwyneth Sprout, FNP,as directed by  Gwyneth Sprout, FNP while in the presence of Gwyneth Sprout, FNP.  ? ?Established patient visit ? ? ?Patient: Teresa Duarte   DOB: [birth year]   71 y.o. Female  MRN: [id number] ?Visit Date: 08/21/2021 ? ?Today's healthcare provider: Gwyneth Sprout, FNP  ?Introduced to Designer, jewellery role and practice setting.  All questions answered.  Discussed provider/patient relationship and expectations. ? ? ?Chief Complaint  ?Patient presents with  ? Leg Pain  ? ?Subjective  ?  ?Leg Pain  ?There was no injury mechanism (Pain occured 10 days ago). The pain is present in the left leg. The quality of the pain is described as aching and cramping. The pain is at a severity of 7/10. The pain has been Fluctuating since onset. Associated symptoms include muscle weakness. Pertinent negatives include no inability to bear weight, loss of motion, loss of sensation, numbness or tingling. She reports no foreign bodies present. Nothing aggravates the symptoms. She has tried nothing for the symptoms.   ? ?Patient continues to walk her dog 2x/day without difficulty; pain does not increase with movement/ambulation. ? ?Medications: ?Outpatient Medications Prior to Visit  ?Medication Sig Note  ? b complex vitamins tablet Take 1 tablet by mouth daily.   ? Cholecalciferol (VITAMIN D3) 50 MCG (2000 UT) capsule Take 2,000 Units by mouth daily.   ? desvenlafaxine (PRISTIQ) 100 MG 24 hr tablet TAKE ONE TABLET BY MOUTH DAILY   ? DILT-XR 180 MG 24 hr capsule TAKE 2 CAPSULES BY MOUTH EVERY DAY   ? loratadine (CLARITIN) 10 MG tablet Take 10 mg by mouth daily.   ? meloxicam (MOBIC) 7.5 MG tablet Mobic 7.5 mg tablet ? Take 1 tablet twice a day by oral route.   ? omeprazole (PRILOSEC) 40 MG capsule TAKE 1 CAPSULE EVERY DAY BEFORE BREAKFAST 08/21/2021: PRN  ? rosuvastatin (CRESTOR) 10 MG tablet TAKE ONE  TABLET EVERY DAY   ? traZODone (DESYREL) 50 MG tablet TAKE ONE TABLET BY MOUTH EVERY NIGHT AT BEDTIME (Patient not taking: Reported on 08/21/2021)   ? valACYclovir (VALTREX) 500 MG tablet As needed (Patient not taking: Reported on 08/21/2021)   ? ?No facility-administered medications prior to visit.  ? ? ?Review of Systems  ?Neurological:  Negative for tingling and numbness.  ? ? ?  Objective  ?  ?BP (!) 142/76   Pulse 70   Temp 98.1 ?F (36.7 ?C) (Oral)   Resp 16   Wt 139 lb 12.8 oz (63.4 kg)   SpO2 100%   BMI 24.76 kg/m?  ? ? ?Physical Exam ?Vitals and nursing note reviewed.  ?Constitutional:   ?   General: She is not in acute distress. ?   Appearance: Normal appearance. She is normal weight. She is not ill-appearing, toxic-appearing or diaphoretic.  ?HENT:  ?   Head: Normocephalic and atraumatic.  ?Cardiovascular:  ?   Rate and Rhythm: Normal rate and regular rhythm.  ?   Pulses: Normal pulses.  ?   Heart sounds: Normal heart sounds. No murmur heard. ?  No friction rub. No gallop.  ?Pulmonary:  ?   Effort: Pulmonary effort is normal. No respiratory distress.  ?   Breath sounds: Normal breath sounds. No stridor. No wheezing, rhonchi or rales.  ?Chest:  ?   Chest wall: No tenderness.  ?Abdominal:  ?   General: Bowel  sounds are normal.  ?   Palpations: Abdomen is soft.  ?Musculoskeletal:     ?   General: Tenderness present. No swelling, deformity or signs of injury. Normal range of motion.  ?   Right lower leg: No edema.  ?   Left lower leg: No edema.  ?     Legs: ? ?   Comments: Small, superficial purple discoloration; pt denies pain s/s to ecchymosis, no other visible veins  ?R calf 0.5cm larger than left on measurement   ?Skin: ?   General: Skin is warm and dry.  ?   Capillary Refill: Capillary refill takes less than 2 seconds.  ?   Coloration: Skin is not jaundiced or pale.  ?   Findings: No bruising, erythema, lesion or rash.  ?Neurological:  ?   General: No focal deficit present.  ?   Mental Status: She is  alert and oriented to person, place, and time. Mental status is at baseline.  ?   Cranial Nerves: No cranial nerve deficit.  ?   Sensory: No sensory deficit.  ?   Motor: No weakness.  ?   Coordination: Coordination normal.  ?Psychiatric:     ?   Mood and Affect: Mood normal.     ?   Behavior: Behavior normal.     ?   Thought Content: Thought content normal.     ?   Judgment: Judgment normal.  ?  ? ?No results found for any visits on 08/21/21. ? Assessment & Plan  ?  ? ?Problem List Items Addressed This Visit   ? ?  ? Other  ? Encounter for screening mammogram for malignant neoplasm of breast  ? Relevant Orders  ? MM 3D SCREEN BREAST BILATERAL  ? Left leg pain - Primary  ?  Acute complaint, undiagnosed ?Pain is waxing/waning ?Report of pain 3/10 ?Describes as a "cramping" ?Area of pain is noted with slight purple discoloration ?No redness, no swelling, RLE was actually 0.5 cm larger than LLE ?No elevated temperature, no change in pulses ?Will send for [place] to r/o DVT ?Has been on Statin for 5 years ?Will check CMP, Mg, D-dimer for abnormalities ?RTC if needed  ? ?  ?  ? Relevant Orders  ? D-Dimer, Quantitative  ? Comprehensive Metabolic Panel (CMET)  ? Magnesium  ? US Venous Img Lower Unilateral Left (DVT)  ? ? ? ?Return if symptoms worsen or fail to improve.  ?   ? ?I, Gwyneth Sprout, FNP, have reviewed all documentation for this visit. The documentation on 08/21/21 for the exam, diagnosis, procedures, and orders are all accurate and complete. ? ?Gwyneth Sprout, FNP  ?[place] ?[phone] (phone) ?[phone] (fax) ? ?[facility] ?

## 2021-08-24 ENCOUNTER — Telehealth: Payer: Self-pay | Admitting: Family Medicine

## 2021-08-24 NOTE — Telephone Encounter (Signed)
Surhi calling from [employer] is calling to inquire about repatha. Received a cover determination Needing the details completed to make to decision. Forms will be faxed ?

## 2021-08-27 NOTE — Telephone Encounter (Signed)
Have you seen any forms come through on patient? KW ?

## 2021-08-28 ENCOUNTER — Other Ambulatory Visit: Payer: Self-pay | Admitting: Family Medicine

## 2021-08-30 ENCOUNTER — Other Ambulatory Visit: Payer: Self-pay | Admitting: Family Medicine

## 2021-08-30 NOTE — Telephone Encounter (Signed)
Requested Prescriptions  ?Pending Prescriptions Disp Refills  ?? desvenlafaxine (PRISTIQ) 100 MG 24 hr tablet [Pharmacy Med Name: DESVENLAFAXINE SUCCNT ER '100MG'$ ] 90 tablet 0  ?  Sig: TAKE ONE TABLET BY MOUTH DAILY  ?  ? Psychiatry: Antidepressants - SNRI - desvenlafaxine & venlafaxine Failed - 08/30/2021  6:22 AM  ?  ?  Failed - Last BP in normal range  ?  BP Readings from Last 1 Encounters:  ?08/21/21 (!) 142/76  ?   ?  ?  Failed - Lipid Panel in normal range within the last 12 months  ?  Cholesterol, Total  ?Date Value Ref Range Status  ?04/25/2021 217 (H) 100 - 199 mg/dL Final  ? ?LDL Chol Calc (NIH)  ?Date Value Ref Range Status  ?04/25/2021 122 (H) 0 - 99 mg/dL Final  ? ?HDL  ?Date Value Ref Range Status  ?04/25/2021 79 >39 mg/dL Final  ? ?Triglycerides  ?Date Value Ref Range Status  ?04/25/2021 93 0 - 149 mg/dL Final  ? ?  ?  ?  Passed - Cr in normal range and within 360 days  ?  Creatinine, Ser  ?Date Value Ref Range Status  ?08/21/2021 0.70 0.57 - 1.00 mg/dL Final  ?   ?  ?  Passed - Completed PHQ-2 or PHQ-9 in the last 360 days  ?  ?  Passed - Valid encounter within last 6 months  ?  Recent Outpatient Visits   ?      ? 1 week ago Left leg pain  ? [facility] Tally Joe T, FNP  ? 4 months ago Annual physical exam  ? [place], DO  ? 1 year ago Essential (primary) hypertension  ? [facility] Jerrol Banana., MD  ? 1 year ago Recurrent major depressive disorder, in partial remission ([place])  ? [facility] Jerrol Banana., MD  ? 1 year ago Acute low back pain, unspecified back pain laterality, unspecified whether sciatica present  ? [facility] [place], [place], [place]  ?  ?  ? ?  ?  ?  ? ? ?

## 2021-09-15 ENCOUNTER — Other Ambulatory Visit: Payer: Self-pay | Admitting: Family Medicine

## 2021-09-15 DIAGNOSIS — I10 Essential (primary) hypertension: Secondary | ICD-10-CM

## 2021-09-28 ENCOUNTER — Ambulatory Visit
Admission: RE | Admit: 2021-09-28 | Discharge: 2021-09-28 | Disposition: A | Discharge status: Home / Self Care | Payer: PPO | Source: Ambulatory Visit | Attending: Family Medicine | Admitting: Family Medicine

## 2021-09-28 DIAGNOSIS — Z1231 Encounter for screening mammogram for malignant neoplasm of breast: Secondary | ICD-10-CM | POA: Diagnosis not present

## 2021-10-16 ENCOUNTER — Encounter: Payer: Self-pay | Admitting: Family Medicine

## 2021-10-25 ENCOUNTER — Encounter: Payer: Self-pay | Admitting: Family Medicine

## 2021-10-26 ENCOUNTER — Ambulatory Visit: Payer: Self-pay

## 2021-10-26 NOTE — Telephone Encounter (Signed)
Pt stated feels like leg is giving out if she let moved up to knee instead of lower knee feels like arthritis.  Pt stated this doesn't happen all the time. but when it does, it feels like she may fall.     Chief Complaint: Continued left knee pain. Seen in April. Wants to know what PCP thinks should be done now. Symptoms: Pain and knee feels "like it could give out when I'm walking." Frequency: 3 months ago Pertinent Negatives: Patient denies  Disposition: '[]'$ ED /'[]'$ Urgent Care (no appt availability in office) / '[]'$ Appointment(In office/virtual)/ '[]'$  [place] [facility]/ '[]'$ Home Care/ '[]'$ Refused Recommended Disposition /'[]'$ [place] [facility]/ '[]'$  Follow-up with PCP Additional Notes: Please advise pt.  Answer Assessment - Initial Assessment Questions 1. LOCATION and RADIATION: "Where is the pain located?"      Left knee 2. QUALITY: "What does the pain feel like?"  (e.g., sharp, dull, aching, burning)     Feels like it will gave way 3. SEVERITY: "How bad is the pain?" "What does it keep you from doing?"   (Scale 1-10; or mild, moderate, severe)   -  MILD (1-3): doesn't interfere with normal activities    -  MODERATE (4-7): interferes with normal activities (e.g., work or school) or awakens from sleep, limping    -  SEVERE (8-10): excruciating pain, unable to do any normal activities, unable to walk     8-9 4. ONSET: "When did the pain start?" "Does it come and go, or is it there all the time?"     3 months ago 5. RECURRENT: "Have you had this pain before?" If Yes, ask: "When, and what happened then?"     No 6. SETTING: "Has there been any recent work, exercise or other activity that involved that part of the body?"      No 7. AGGRAVATING FACTORS: "What makes the knee pain worse?" (e.g., walking, climbing stairs, running)     Walking 8. ASSOCIATED SYMPTOMS: "Is there any swelling or redness of the knee?"     No 9. OTHER SYMPTOMS: "Do you have any other symptoms?" (e.g., chest pain,  difficulty breathing, fever, calf pain)     No 10. PREGNANCY: "Is there any chance you are pregnant?" "When was your last menstrual period?"       No  Protocols used: Knee Pain-A-AH

## 2021-10-26 NOTE — Telephone Encounter (Signed)
Patient scheduled 10/29/21 with Dr. Rosanna Randy.

## 2021-10-29 ENCOUNTER — Ambulatory Visit
Admission: RE | Admit: 2021-10-29 | Discharge: 2021-10-29 | Disposition: A | Discharge status: Home / Self Care | Payer: PPO | Attending: Family Medicine | Admitting: Family Medicine

## 2021-10-29 ENCOUNTER — Encounter: Payer: Self-pay | Admitting: Family Medicine

## 2021-10-29 ENCOUNTER — Ambulatory Visit
Admission: RE | Admit: 2021-10-29 | Discharge: 2021-10-29 | Disposition: A | Discharge status: Home / Self Care | Payer: PPO | Source: Ambulatory Visit | Attending: Family Medicine | Admitting: Family Medicine

## 2021-10-29 ENCOUNTER — Ambulatory Visit (INDEPENDENT_AMBULATORY_CARE_PROVIDER_SITE_OTHER): Payer: PPO | Admitting: Family Medicine

## 2021-10-29 VITALS — BP 114/79 | HR 79 | Temp 98.5°F | Resp 16 | Ht 63.0 in | Wt 142.9 lb

## 2021-10-29 DIAGNOSIS — M25562 Pain in left knee: Secondary | ICD-10-CM

## 2021-10-29 DIAGNOSIS — G2581 Restless legs syndrome: Secondary | ICD-10-CM | POA: Diagnosis not present

## 2021-10-29 MED ORDER — GABAPENTIN 100 MG PO CAPS: 100.0000 mg | ORAL_CAPSULE | Freq: Every day | ORAL | 0 refills | Status: DC

## 2021-10-29 NOTE — Progress Notes (Unsigned)
I,Teresa Duarte,acting as a scribe for Teresa Durie, MD.,have documented all relevant documentation on the behalf of Teresa Durie, MD,as directed by  Teresa Durie, MD while in the presence of Teresa Durie, MD.   Established patient visit   Patient: Teresa Duarte   DOB: 06-Jul-1950   71 y.o. Female  MRN: [id number] Visit Date: 10/29/2021  Today's healthcare provider: [name], MD   Chief Complaint  Patient presents with   Knee Pain   Subjective    Knee Pain  There was no injury mechanism. The pain is present in the left knee. The quality of the pain is described as aching. The pain is at a severity of 8/10. The pain is moderate. The pain has been Fluctuating since onset. Pertinent negatives include no inability to bear weight, loss of motion, loss of sensation, numbness or tingling. She reports no foreign bodies present. The symptoms are aggravated by movement. She has tried elevation and rest for the symptoms. The treatment provided no relief.    She denies any known trauma.  No obvious swelling.  No fever or chills.  No weakness of the leg or rash Patient is coping with her stress fairly well.  Her husband died a couple years ago in a motorcycle accident and her son is an addict and not doing well. Medications: Outpatient Medications Prior to Visit  Medication Sig   Calcium-Vitamin D-Vitamin K 500-100-40 MG-UNT-MCG CHEW Chew by mouth. Calcium and Vitamin K   desvenlafaxine (PRISTIQ) 100 MG 24 hr tablet TAKE ONE TABLET BY MOUTH DAILY   DILT-XR 180 MG 24 hr capsule TAKE 2 CAPSULES BY MOUTH EVERY DAY   loratadine (CLARITIN) 10 MG tablet Take 10 mg by mouth daily.   meloxicam (MOBIC) 7.5 MG tablet Mobic 7.5 mg tablet  Take 1 tablet twice a day by oral route.   rosuvastatin (CRESTOR) 10 MG tablet TAKE ONE TABLET EVERY DAY   traZODone (DESYREL) 50 MG tablet TAKE ONE TABLET BY MOUTH EVERY NIGHT AT BEDTIME   TURMERIC-GINGER PO Take by mouth. chewables    valACYclovir (VALTREX) 500 MG tablet As needed   b complex vitamins tablet Take 1 tablet by mouth daily.   Cholecalciferol (VITAMIN D3) 50 MCG (2000 UT) capsule Take 2,000 Units by mouth daily.   Evolocumab 140 MG/ML SOSY Inject 140 mg into the skin every 14 (fourteen) days.   omeprazole (PRILOSEC) 40 MG capsule TAKE 1 CAPSULE EVERY DAY BEFORE BREAKFAST   No facility-administered medications prior to visit.    Review of Systems  Neurological:  Negative for tingling and numbness.    Last lipids Lab Results  Component Value Date   CHOL 217 (H) 04/25/2021   HDL 79 04/25/2021   LDLCALC 122 (H) 04/25/2021   TRIG 93 04/25/2021   CHOLHDL 2.7 04/25/2021       Objective    BP 114/79 (BP Location: Right Arm, Patient Position: Sitting, Cuff Size: Normal)   Pulse 79   Temp 98.5 F (36.9 C) (Oral)   Resp 16   Ht '5\' 3"'$  (1.6 m)   Wt 142 lb 14.4 oz (64.8 kg)   BMI 25.31 kg/m  BP Readings from Last 3 Encounters:  10/29/21 114/79  08/21/21 (!) 142/76  04/25/21 131/76   Wt Readings from Last 3 Encounters:  10/29/21 142 lb 14.4 oz (64.8 kg)  08/21/21 139 lb 12.8 oz (63.4 kg)  04/25/21 135 lb (61.2 kg)      Physical Exam  Vitals reviewed.  Constitutional:      General: She is not in acute distress.    Appearance: She is well-developed.  HENT:     Head: Normocephalic and atraumatic.     Right Ear: Hearing normal.     Left Ear: Hearing normal.     Nose: Nose normal.  Eyes:     General: Lids are normal. No scleral icterus.       Right eye: No discharge.        Left eye: No discharge.     Conjunctiva/sclera: Conjunctivae normal.  Cardiovascular:     Rate and Rhythm: Normal rate and regular rhythm.     Heart sounds: Normal heart sounds.  Pulmonary:     Effort: Pulmonary effort is normal. No respiratory distress.  Musculoskeletal:     Comments: Examination of the left knee is remarkable only for medial joint line tenderness.  No effusion or otherwise abnormal exam.  The knee  is an inherently stable.  Skin:    General: Skin is warm and dry.     Findings: No lesion or rash.  Neurological:     General: No focal deficit present.     Mental Status: She is alert and oriented to person, place, and time.  Psychiatric:        Mood and Affect: Mood normal.        Speech: Speech normal.        Behavior: Behavior normal.        Thought Content: Thought content normal.        Judgment: Judgment normal.       No results found for any visits on 10/29/21.  Assessment & Plan     1. Acute pain of left knee This may in fact be a meniscal injury or arthritic. It also be back problem with sciatica but this does not fit the description given today. - DG Knee Complete 4 Views Left  2. Restless leg Try gabapentin. - gabapentin (NEURONTIN) 100 MG capsule; Take 1 capsule (100 mg total) by mouth at bedtime.  Dispense: 90 capsule; Refill: 0   No follow-ups on file.      I, Teresa Durie, MD, have reviewed all documentation for this visit. The documentation on 10/31/21 for the exam, diagnosis, procedures, and orders are all accurate and complete.    Chong January Cranford Mon, MD  [facility] [phone] (phone) [phone] (fax)  [place]

## 2021-11-23 ENCOUNTER — Telehealth: Payer: Self-pay | Admitting: Family Medicine

## 2021-11-23 ENCOUNTER — Other Ambulatory Visit: Payer: Self-pay

## 2021-11-23 MED ORDER — DESVENLAFAXINE SUCCINATE ER 100 MG PO TB24: 100.0000 mg | ORAL_TABLET | Freq: Every day | ORAL | 0 refills | Status: DC

## 2021-11-23 NOTE — Telephone Encounter (Signed)
Refill sent.  KP 

## 2021-11-23 NOTE — Telephone Encounter (Signed)
[facility] faxed refill request for the following medications:  desvenlafaxine (PRISTIQ) 100 MG 24 hr tablet   Please advise.

## 2021-11-25 ENCOUNTER — Other Ambulatory Visit: Payer: Self-pay | Admitting: Family Medicine

## 2021-11-27 NOTE — Telephone Encounter (Signed)
Requested medication (s) are due for refill today: no  Requested medication (s) are on the active medication list: yes  Last refill:  11/23/21  Future visit scheduled: yes  Notes to clinic:  Unable to refill per protocol, last refill by provider on 11/23/21 for 90 days. Possible duplicate.     Requested Prescriptions  Pending Prescriptions Disp Refills   desvenlafaxine (PRISTIQ) 100 MG 24 hr tablet [Pharmacy Med Name: DESVENLAFAXINE SUCCNT ER '100MG'$ ] 90 tablet 0    Sig: TAKE ONE TABLET BY MOUTH DAILY     Psychiatry: Antidepressants - SNRI - desvenlafaxine & venlafaxine Failed - 11/25/2021 12:54 PM      Failed - Lipid Panel in normal range within the last 12 months    Cholesterol, Total  Date Value Ref Range Status  04/25/2021 217 (H) 100 - 199 mg/dL Final   LDL Chol Calc (NIH)  Date Value Ref Range Status  04/25/2021 122 (H) 0 - 99 mg/dL Final   HDL  Date Value Ref Range Status  04/25/2021 79 >39 mg/dL Final   Triglycerides  Date Value Ref Range Status  04/25/2021 93 0 - 149 mg/dL Final         Passed - Cr in normal range and within 360 days    Creatinine, Ser  Date Value Ref Range Status  08/21/2021 0.70 0.57 - 1.00 mg/dL Final         Passed - Completed PHQ-2 or PHQ-9 in the last 360 days      Passed - Last BP in normal range    BP Readings from Last 1 Encounters:  10/29/21 114/79         Passed - Valid encounter within last 6 months    Recent Outpatient Visits           4 weeks ago Acute pain of left knee   [facility] Jerrol Banana., MD   3 months ago Left leg pain   [facility] Gwyneth Sprout, FNP   7 months ago Annual physical exam   [facility] Myles Gip, DO   1 year ago Essential (primary) hypertension   [facility] Jerrol Banana., MD   1 year ago Recurrent major depressive disorder, in partial remission [facility] [place])   [facility] Jerrol Banana., MD        Future Appointments             In 2 weeks Jerrol Banana., MD [facility] [place]

## 2021-12-06 ENCOUNTER — Telehealth: Payer: Self-pay

## 2021-12-06 NOTE — Telephone Encounter (Signed)
Copied from [place] [id number]. Topic: Medical Record Request - Patient ROI Request >> Dec 06, 2021  3:34 PM Everette C wrote: Reason for CRM: The patient has called to request a copy of their xrays and ultrasounds be sent to [facility] (Dr. Sabra Heck) on a disk   The patient has an appt 12/13/21 with Dr. Sabra Heck and would like the images prior to their appt   Please contact further when possible

## 2021-12-07 NOTE — Telephone Encounter (Signed)
Copied from [place] [id number]. Topic: Medical Record Request - Patient ROI Request >> Dec 06, 2021  3:34 PM Everette C wrote: Reason for CRM: The patient has called to request a copy of their xrays and ultrasounds be sent to [facility] (Dr. Sabra Heck) on a disk   The patient has an appt 12/13/21 with Dr. Sabra Heck and would like the images prior to their appt   Please contact further when possible

## 2021-12-07 NOTE — Telephone Encounter (Signed)
Called the pt and advised her to reach out to outpatient imaging to have them send over the requested information.    Advised pt, sometimes the requesting office will have to send them a request in order to initiate the imaging being sent.

## 2021-12-08 ENCOUNTER — Other Ambulatory Visit: Payer: Self-pay | Admitting: Family Medicine

## 2021-12-08 DIAGNOSIS — I10 Essential (primary) hypertension: Secondary | ICD-10-CM

## 2021-12-12 ENCOUNTER — Ambulatory Visit: Payer: PPO | Admitting: Family Medicine

## 2021-12-17 ENCOUNTER — Encounter: Payer: Self-pay | Admitting: Family Medicine

## 2021-12-20 DIAGNOSIS — M65331 Trigger finger, right middle finger: Secondary | ICD-10-CM | POA: Diagnosis not present

## 2021-12-20 DIAGNOSIS — M1712 Unilateral primary osteoarthritis, left knee: Secondary | ICD-10-CM | POA: Diagnosis not present

## 2022-01-01 ENCOUNTER — Telehealth: Payer: Self-pay | Admitting: Family Medicine

## 2022-01-01 NOTE — Telephone Encounter (Signed)
Patient called and wanted to speak with Arbie Cookey, advised pt that Arbie Cookey was with another patient. Patient did not indicate the reason for call. Please advise patient at [phone].

## 2022-01-02 NOTE — Telephone Encounter (Signed)
Patient was told she could remain in the practice if she wanted to see Dr. Quentin Cornwall or the PAs or FNP.    She said she would decide and let us know.

## 2022-01-28 ENCOUNTER — Other Ambulatory Visit: Payer: Self-pay | Admitting: Family Medicine

## 2022-01-28 DIAGNOSIS — G2581 Restless legs syndrome: Secondary | ICD-10-CM

## 2022-01-29 ENCOUNTER — Other Ambulatory Visit: Payer: Self-pay | Admitting: Physician Assistant

## 2022-01-29 DIAGNOSIS — E7800 Pure hypercholesterolemia, unspecified: Secondary | ICD-10-CM

## 2022-01-29 NOTE — Telephone Encounter (Signed)
Requested medication (s) are due for refill today:   Yes  Requested medication (s) are on the active medication list:   Yes  Future visit scheduled:   No.  Former pt of Dr. Rosanna Randy.   Looks like she has established care with [facility] 01/14/2022.      Last ordered: 05/11/2021 390, 3 refills  Returned because in chart on 01/14/2022 it looks like she has established care with [facility].       Requested Prescriptions  Pending Prescriptions Disp Refills   rosuvastatin (CRESTOR) 10 MG tablet [Pharmacy Med Name: ROSUVASTATIN CALCIUM 10 MG TAB] 90 tablet 0    Sig: TAKE ONE TABLET EVERY DAY     Cardiovascular:  Antilipid - Statins 2 Failed - 01/29/2022 10:03 AM      Failed - Lipid Panel in normal range within the last 12 months    Cholesterol, Total  Date Value Ref Range Status  04/25/2021 217 (H) 100 - 199 mg/dL Final   LDL Chol Calc (NIH)  Date Value Ref Range Status  04/25/2021 122 (H) 0 - 99 mg/dL Final   HDL  Date Value Ref Range Status  04/25/2021 79 >39 mg/dL Final   Triglycerides  Date Value Ref Range Status  04/25/2021 93 0 - 149 mg/dL Final         Passed - Cr in normal range and within 360 days    Creatinine, Ser  Date Value Ref Range Status  08/21/2021 0.70 0.57 - 1.00 mg/dL Final         Passed - Patient is not pregnant      Passed - Valid encounter within last 12 months    Recent Outpatient Visits           3 months ago Acute pain of left knee   [facility] Jerrol Banana., MD   5 months ago Left leg pain   [facility] Gwyneth Sprout, FNP   9 months ago Annual physical exam   [facility] Myles Gip, DO   1 year ago Essential (primary) hypertension   [facility] Jerrol Banana., MD   1 year ago Recurrent major depressive disorder, in partial remission [facility])   [facility] Jerrol Banana., MD

## 2022-02-12 DIAGNOSIS — M79671 Pain in right foot: Secondary | ICD-10-CM | POA: Diagnosis not present

## 2022-02-12 DIAGNOSIS — M722 Plantar fascial fibromatosis: Secondary | ICD-10-CM | POA: Diagnosis not present

## 2022-03-05 DIAGNOSIS — M79671 Pain in right foot: Secondary | ICD-10-CM | POA: Diagnosis not present

## 2022-03-05 DIAGNOSIS — M62461 Contracture of muscle, right lower leg: Secondary | ICD-10-CM | POA: Diagnosis not present

## 2022-03-05 DIAGNOSIS — M722 Plantar fascial fibromatosis: Secondary | ICD-10-CM | POA: Diagnosis not present

## 2022-03-05 DIAGNOSIS — G5751 Tarsal tunnel syndrome, right lower limb: Secondary | ICD-10-CM | POA: Diagnosis not present

## 2022-03-11 ENCOUNTER — Telehealth: Payer: Self-pay | Admitting: Family Medicine

## 2022-03-11 MED ORDER — DESVENLAFAXINE SUCCINATE ER 100 MG PO TB24: 100.0000 mg | ORAL_TABLET | Freq: Every day | ORAL | 0 refills | Status: DC

## 2022-03-11 NOTE — Telephone Encounter (Signed)
[facility] faxed refill request for the following medications:  desvenlafaxine (PRISTIQ) 100 MG 24 hr tablet    Please advise

## 2022-03-26 DIAGNOSIS — M79671 Pain in right foot: Secondary | ICD-10-CM | POA: Diagnosis not present

## 2022-03-26 DIAGNOSIS — M722 Plantar fascial fibromatosis: Secondary | ICD-10-CM | POA: Diagnosis not present

## 2022-03-26 DIAGNOSIS — G5751 Tarsal tunnel syndrome, right lower limb: Secondary | ICD-10-CM | POA: Diagnosis not present

## 2022-04-26 NOTE — Progress Notes (Unsigned)
Annual Wellness Visit     Patient: Teresa Duarte, Female    DOB: [birth year], 71 y.o.   MRN: [id number] Visit Date: 04/30/2022  Today's Provider: Eulis Grave, MD   No chief complaint on file.  Subjective    Teresa Duarte is a 71 y.o. female who presents today for her Annual Wellness Visit. She reports consuming a {diet types:17450} diet. {Exercise:19826} She generally feels {well/fairly well/poorly:18703}. She reports sleeping {well/fairly well/poorly:18703}. She {does/does not:200015} have additional problems to discuss today.   HPI  Medications: Outpatient Medications Prior to Visit  Medication Sig   b complex vitamins tablet Take 1 tablet by mouth daily.   Calcium-Vitamin D-Vitamin K 287-681-15 MG-UNT-MCG CHEW Chew by mouth. Calcium and Vitamin K   Cholecalciferol (VITAMIN D3) 50 MCG (2000 UT) capsule Take 2,000 Units by mouth daily.   desvenlafaxine (PRISTIQ) 100 MG 24 hr tablet Take 1 tablet (100 mg total) by mouth daily.   DILT-XR 180 MG 24 hr capsule TAKE 2 CAPSULES BY MOUTH EVERY DAY   Evolocumab 140 MG/ML SOSY Inject 140 mg into the skin every 14 (fourteen) days.   gabapentin (NEURONTIN) 100 MG capsule TAKE 1 CAPSULE BY MOUTH AT BEDTIME   loratadine (CLARITIN) 10 MG tablet Take 10 mg by mouth daily.   meloxicam (MOBIC) 7.5 MG tablet Mobic 7.5 mg tablet  Take 1 tablet twice a day by oral route.   omeprazole (PRILOSEC) 40 MG capsule TAKE 1 CAPSULE EVERY DAY BEFORE BREAKFAST   rosuvastatin (CRESTOR) 10 MG tablet TAKE ONE TABLET EVERY DAY   traZODone (DESYREL) 50 MG tablet TAKE ONE TABLET BY MOUTH EVERY NIGHT AT BEDTIME   TURMERIC-GINGER PO Take by mouth. chewables   valACYclovir (VALTREX) 500 MG tablet As needed   No facility-administered medications prior to visit.    Allergies  Allergen Reactions   Other Other (See Comments)    Anesthesia caused nausea and vomiting about 20 years ago    Patient Care Team: Jerrol Banana., MD as PCP - General  (Family Medicine) Benjaman Kindler, MD as Consulting Physician (Obstetrics and Gynecology) Estill Cotta, MD (Ophthalmology) Richmond Campbell, MD as Consulting Physician (Gastroenterology) Roseanne Kaufman, MD as Consulting Physician (Orthopedic Surgery) Jarome Matin, MD as Consulting Physician (Dermatology)  Review of Systems  All other systems reviewed and are negative.   Last CBC Lab Results  Component Value Date   WBC 5.3 04/25/2021   HGB 13.3 04/25/2021   HCT 39.7 04/25/2021   MCV 96 04/25/2021   MCH 32.0 04/25/2021   RDW 12.4 04/25/2021   PLT 220 72/62/0355   Last metabolic panel Lab Results  Component Value Date   GLUCOSE 123 (H) 08/21/2021   NA 140 08/21/2021   K 4.3 08/21/2021   CL 100 08/21/2021   CO2 23 08/21/2021   BUN 13 08/21/2021   CREATININE 0.70 08/21/2021   EGFR 93 08/21/2021   CALCIUM 9.9 08/21/2021   PROT 7.3 08/21/2021   ALBUMIN 5.0 (H) 08/21/2021   LABGLOB 2.3 08/21/2021   AGRATIO 2.2 08/21/2021   BILITOT 0.5 08/21/2021   ALKPHOS 81 08/21/2021   AST 24 08/21/2021   ALT 13 08/21/2021   ANIONGAP 14 02/22/2014   Last lipids Lab Results  Component Value Date   CHOL 217 (H) 04/25/2021   HDL 79 04/25/2021   LDLCALC 122 (H) 04/25/2021   TRIG 93 04/25/2021   CHOLHDL 2.7 04/25/2021   Last hemoglobin A1c Lab Results  Component Value Date   HGBA1C 5.1 10/03/2017  Last thyroid functions Lab Results  Component Value Date   TSH 1.640 05/05/2019       Objective    Vitals: There were no vitals taken for this visit. BP Readings from Last 3 Encounters:  10/29/21 114/79  08/21/21 (!) 142/76  04/25/21 131/76   Wt Readings from Last 3 Encounters:  10/29/21 142 lb 14.4 oz (64.8 kg)  08/21/21 139 lb 12.8 oz (63.4 kg)  04/25/21 135 lb (61.2 kg)       Physical Exam ***  Most recent functional status assessment:     No data to display         Most recent fall risk assessment:    04/25/2021    1:33 PM  [place] in  the past year? 0  Number falls in past yr: 0  Injury with Fall? 0  Risk for fall due to : No Fall Risks  Follow up Falls evaluation completed    Most recent depression screenings:    04/25/2021    1:33 PM 03/29/2020    1:30 PM  PHQ 2/9 Scores  PHQ - 2 Score 2 0  PHQ- 9 Score 4 3   Most recent cognitive screening:     No data to display         Most recent Audit-C alcohol use screening    04/25/2021    1:32 PM  Alcohol Use Disorder Test (AUDIT)  1. How often do you have a drink containing alcohol? 4  2. How many drinks containing alcohol do you have on a typical day when you are drinking? 1  3. How often do you have six or more drinks on one occasion? 4  AUDIT-C Score 9   A score of 3 or more in women, and 4 or more in men indicates increased risk for alcohol abuse, EXCEPT if all of the points are from question 1   No results found for any visits on 04/30/22.  Assessment & Plan     Annual wellness visit done today including the all of the following: Reviewed patient's Family Medical History Reviewed and updated list of patient's medical providers Assessment of cognitive impairment was done Assessed patient's functional ability Established a written schedule for health screening [place] Completed and Reviewed  Exercise Activities and Dietary recommendations  Goals      Reduce alcohol intake     Recommend cutting back on alcohol consumption to 1 glass of a wine a day.          Immunization History  Administered Date(s) Administered   Influenza, High Dose Seasonal PF 02/07/2016, 02/16/2019, 04/05/2021   Influenza,inj,Quad PF,6+ Mos 02/24/2013, 02/17/2014   Influenza-Unspecified 03/09/2018   Pneumococcal Conjugate-13 02/07/2016   Pneumococcal Polysaccharide-23 04/08/2018   Unspecified SARS-COV-2 Vaccination 05/30/2020, 06/26/2020    Health Maintenance  Topic Date Due   DTaP/Tdap/Td (1 - Tdap) Never done   Zoster Vaccines- Shingrix  (1 of 2) Never done   Medicare Annual Wellness (AWV)  06/15/2020   DEXA SCAN  07/21/2021   INFLUENZA VACCINE  11/27/2021   COVID-19 Vaccine (3 - 2023-24 season) 12/28/2021   MAMMOGRAM  09/29/2023   COLONOSCOPY (Pts 45-1yr Insurance coverage will need to be confirmed)  01/05/2031   Pneumonia Vaccine 71 Years old  Completed   Hepatitis C Screening  Completed   HPV VACCINES  Aged Out     Discussed health benefits of physical activity, and encouraged her to engage in regular exercise appropriate for  her age and condition.    ***  No follow-ups on file.     {provider attestation***:1}   Eulis Kern, MD  [facility] [phone] (phone) [phone] (fax)  [place]

## 2022-04-30 ENCOUNTER — Encounter: Payer: Self-pay | Admitting: Family Medicine

## 2022-04-30 ENCOUNTER — Ambulatory Visit (INDEPENDENT_AMBULATORY_CARE_PROVIDER_SITE_OTHER): Payer: PPO | Admitting: Family Medicine

## 2022-04-30 VITALS — BP 140/80 | HR 53 | Temp 98.6°F | Resp 16 | Ht 63.0 in | Wt 140.8 lb

## 2022-04-30 DIAGNOSIS — R739 Hyperglycemia, unspecified: Secondary | ICD-10-CM | POA: Diagnosis not present

## 2022-04-30 DIAGNOSIS — Z0000 Encounter for general adult medical examination without abnormal findings: Secondary | ICD-10-CM

## 2022-04-30 DIAGNOSIS — E7800 Pure hypercholesterolemia, unspecified: Secondary | ICD-10-CM

## 2022-04-30 DIAGNOSIS — F3341 Major depressive disorder, recurrent, in partial remission: Secondary | ICD-10-CM

## 2022-04-30 DIAGNOSIS — I10 Essential (primary) hypertension: Secondary | ICD-10-CM

## 2022-04-30 DIAGNOSIS — M8580 Other specified disorders of bone density and structure, unspecified site: Secondary | ICD-10-CM

## 2022-04-30 MED ORDER — DESVENLAFAXINE SUCCINATE ER 50 MG PO TB24: 50.0000 mg | ORAL_TABLET | Freq: Every day | ORAL | 0 refills | Status: DC

## 2022-04-30 NOTE — Patient Instructions (Signed)
Please start takin 1.5 pills of the Pristiq for a total of '75mg'$  daily for the next 1 month   Health Maintenance After Age 72 After age 69, you are at a higher risk for certain long-term diseases and infections as well as injuries from falls. Falls are a major cause of broken bones and head injuries in people who are older than age 21. Getting regular preventive care can help to keep you healthy and well. Preventive care includes getting regular testing and making lifestyle changes as recommended by your health care provider. Talk with your health care provider about: Which screenings and tests you should have. A screening is a test that checks for a disease when you have no symptoms. A diet and exercise plan that is right for you. What should I know about screenings and tests to prevent falls? Screening and testing are the best ways to find a health problem early. Early diagnosis and treatment give you the best chance of managing medical conditions that are common after age 21. Certain conditions and lifestyle choices may make you more likely to have a fall. Your health care provider may recommend: Regular vision checks. Poor vision and conditions such as cataracts can make you more likely to have a fall. If you wear glasses, make sure to get your prescription updated if your vision changes. Medicine review. Work with your health care provider to regularly review all of the medicines you are taking, including over-the-counter medicines. Ask your health care provider about any side effects that may make you more likely to have a fall. Tell your health care provider if any medicines that you take make you feel dizzy or sleepy. Strength and balance checks. Your health care provider may recommend certain tests to check your strength and balance while standing, walking, or changing positions. Foot health exam. Foot pain and numbness, as well as not wearing proper footwear, can make you more likely to have a  fall. Screenings, including: Osteoporosis screening. Osteoporosis is a condition that causes the bones to get weaker and break more easily. Blood pressure screening. Blood pressure changes and medicines to control blood pressure can make you feel dizzy. Depression screening. You may be more likely to have a fall if you have a fear of falling, feel depressed, or feel unable to do activities that you used to do. Alcohol use screening. Using too much alcohol can affect your balance and may make you more likely to have a fall. Follow these instructions at home: Lifestyle Do not drink alcohol if: Your health care provider tells you not to drink. If you drink alcohol: Limit how much you have to: 0-1 drink a day for women. 0-2 drinks a day for men. Know how much alcohol is in your drink. In the [place] one drink equals one 12 oz bottle of beer (355 mL), one 5 oz glass of wine (148 mL), or one 1 oz glass of hard liquor (44 mL). Do not use any products that contain nicotine or tobacco. These products include cigarettes, chewing tobacco, and vaping devices, such as e-cigarettes. If you need help quitting, ask your health care provider. Activity  Follow a regular exercise program to stay fit. This will help you maintain your balance. Ask your health care provider what types of exercise are appropriate for you. If you need a cane or walker, use it as recommended by your health care provider. Wear supportive shoes that have nonskid soles. Safety  Remove any tripping hazards, such as rugs,  cords, and clutter. Install safety equipment such as grab bars in bathrooms and safety rails on stairs. Keep rooms and walkways well-lit. General instructions Talk with your health care provider about your risks for falling. Tell your health care provider if: You fall. Be sure to tell your health care provider about all falls, even ones that seem minor. You feel dizzy, tiredness (fatigue), or off-balance. Take  over-the-counter and prescription medicines only as told by your health care provider. These include supplements. Eat a healthy diet and maintain a healthy weight. A healthy diet includes low-fat dairy products, low-fat (lean) meats, and fiber from whole grains, beans, and lots of fruits and vegetables. Stay current with your vaccines. Schedule regular health, dental, and eye exams. Summary Having a healthy lifestyle and getting preventive care can help to protect your health and wellness after age 77. Screening and testing are the best way to find a health problem early and help you avoid having a fall. Early diagnosis and treatment give you the best chance for managing medical conditions that are more common for people who are older than age 17. Falls are a major cause of broken bones and head injuries in people who are older than age 71. Take precautions to prevent a fall at home. Work with your health care provider to learn what changes you can make to improve your health and wellness and to prevent falls. This information is not intended to replace advice given to you by your health care provider. Make sure you discuss any questions you have with your health care provider. Document Revised: 09/04/2020 Document Reviewed: 09/04/2020 Elsevier Patient Education  [place].

## 2022-05-01 ENCOUNTER — Other Ambulatory Visit: Payer: Self-pay | Admitting: Physician Assistant

## 2022-05-01 DIAGNOSIS — E7800 Pure hypercholesterolemia, unspecified: Secondary | ICD-10-CM

## 2022-05-01 DIAGNOSIS — Z0000 Encounter for general adult medical examination without abnormal findings: Secondary | ICD-10-CM | POA: Insufficient documentation

## 2022-05-01 LAB — COMPREHENSIVE METABOLIC PANEL
ALT: 19 IU/L (ref 0–32)
AST: 26 IU/L (ref 0–40)
Albumin/Globulin Ratio: 2.4 — ABNORMAL HIGH (ref 1.2–2.2)
Albumin: 4.5 g/dL (ref 3.8–4.8)
Alkaline Phosphatase: 76 IU/L (ref 44–121)
BUN/Creatinine Ratio: 23 (ref 12–28)
BUN: 18 mg/dL (ref 8–27)
Bilirubin Total: 0.3 mg/dL (ref 0.0–1.2)
CO2: 27 mmol/L (ref 20–29)
Calcium: 9.9 mg/dL (ref 8.7–10.3)
Chloride: 101 mmol/L (ref 96–106)
Creatinine, Ser: 0.8 mg/dL (ref 0.57–1.00)
Globulin, Total: 1.9 g/dL (ref 1.5–4.5)
Glucose: 142 mg/dL — ABNORMAL HIGH (ref 70–99)
Potassium: 4.2 mmol/L (ref 3.5–5.2)
Sodium: 141 mmol/L (ref 134–144)
Total Protein: 6.4 g/dL (ref 6.0–8.5)
eGFR: 79 mL/min/{1.73_m2} (ref >59)

## 2022-05-01 LAB — HEMOGLOBIN A1C
Est. average glucose Bld gHb Est-mCnc: 114 mg/dL
Hgb A1c MFr Bld: 5.6 % (ref 4.8–5.6)

## 2022-05-01 LAB — LDL CHOLESTEROL, DIRECT: LDL Direct: 99 mg/dL (ref 0–99)

## 2022-05-01 NOTE — Assessment & Plan Note (Signed)
Borderline blood pressure today Continue current blood pressure medications at current doses CMP collected today

## 2022-05-01 NOTE — Assessment & Plan Note (Signed)
DEXA scan ordered today 

## 2022-05-01 NOTE — Assessment & Plan Note (Signed)
Hemoglobin A1c ordered for diabetes screening CMP to measure glucose

## 2022-05-01 NOTE — Assessment & Plan Note (Signed)
start taking 1.5 pills of the Pristiq for a total of '75mg'$  daily for the next 1 month

## 2022-05-01 NOTE — Assessment & Plan Note (Addendum)
Direct LDL ordered today Patient to continue rosuvastatin 10 mg daily

## 2022-05-01 NOTE — Assessment & Plan Note (Signed)
Recommended age-appropriate vaccines as noted above Patient declines

## 2022-05-03 ENCOUNTER — Ambulatory Visit: Payer: Self-pay

## 2022-05-03 ENCOUNTER — Other Ambulatory Visit: Payer: Self-pay | Admitting: Family Medicine

## 2022-05-03 DIAGNOSIS — F3341 Major depressive disorder, recurrent, in partial remission: Secondary | ICD-10-CM

## 2022-05-03 MED ORDER — DESVENLAFAXINE SUCCINATE ER 25 MG PO TB24: 25.0000 mg | ORAL_TABLET | Freq: Every day | ORAL | 0 refills | Status: DC

## 2022-05-03 MED ORDER — DESVENLAFAXINE SUCCINATE ER 50 MG PO TB24: 50.0000 mg | ORAL_TABLET | Freq: Every day | ORAL | 2 refills | Status: DC

## 2022-05-03 NOTE — Telephone Encounter (Signed)
The patient called in stating  she had talked to her provider about decreasing dosage on her desvenlafaxine (PRISTIQ) 50 MG 24 hr tablet. Please call patient for further clarification as patient spoke with Kennyth Lose from [place] and she didn't want the patient to cut her pills.  Answer Assessment - Initial Assessment Questions  Protocols used: Medication Question Call-A-AH Pt. Currently on Pristiq 75 mg. Pharmacy does not want her to cut pills. Asking for 50 mg and 25 mg x 1 month to be sent to pharmacy. States she is then to start 50 mg daily.   Answer Assessment - Initial Assessment Questions 1. NAME of MEDICINE: "What medicine(s) are you calling about?"     Pristiq 2. QUESTION: "What is your question?" (e.g., double dose of medicine, side effect)     Pharmacy does not want her to cut the pills. Needs 50 mg and 25 mg for one month sent to pharmacy. After that pt. Is supposed to be on 50 mg daily. 3. PRESCRIBER: "Who prescribed the medicine?" Reason: if prescribed by specialist, call should be referred to that group.     Dr. Alba Cory 4. SYMPTOMS: "Do you have any symptoms?" If Yes, ask: "What symptoms are you having?"  "How bad are the symptoms (e.g., mild, moderate, severe)     N/a 5. PREGNANCY:  "Is there any chance that you are pregnant?" "When was your last menstrual period?"     No  Protocols used: Medication Question Call-A-AH

## 2022-05-03 NOTE — Progress Notes (Signed)
Pharmacy called for clarification of Rx directions- advised per chart- and new Rx should be in system.  start taking 1.5 pills of the Pristiq for a total of '75mg'$  daily for the next 1 month

## 2022-05-13 ENCOUNTER — Ambulatory Visit
Admission: RE | Admit: 2022-05-13 | Discharge: 2022-05-13 | Disposition: A | Discharge status: Home / Self Care | Payer: PPO | Source: Ambulatory Visit | Attending: Family Medicine | Admitting: Family Medicine

## 2022-05-13 DIAGNOSIS — Z13820 Encounter for screening for osteoporosis: Secondary | ICD-10-CM | POA: Insufficient documentation

## 2022-05-13 DIAGNOSIS — Z780 Asymptomatic menopausal state: Secondary | ICD-10-CM | POA: Insufficient documentation

## 2022-05-13 DIAGNOSIS — M8589 Other specified disorders of bone density and structure, multiple sites: Secondary | ICD-10-CM | POA: Insufficient documentation

## 2022-05-31 NOTE — Progress Notes (Unsigned)
I,[name],acting as a scribe for [employer], MD.,have documented all relevant documentation on the behalf of Eulis Langer, MD,as directed by  Eulis Krupka, MD while in the presence of Eulis Loomis, MD.   Established patient visit   Patient: Teresa Duarte   DOB: 02-13-1951   72 y.o. Female  MRN: [id number] Visit Date: 06/03/2022  Today's healthcare provider: Eulis Vannote, MD   Chief Complaint  Patient presents with   Follow-up Depression   Subjective    HPI  Depression, Follow-up  She  was last seen for this 1 months ago. Changes made at last visit include start taking 1.5 pills of the Pristiq for a total of '75mg'$  daily .   She reports excellent compliance with treatment. She is not having side effects.   She reports excellent tolerance of treatment. Current symptoms include: depressed mood She feels she is Improved since last visit.  Concern for carotid artery health Patient states that she has been researching about carotid arteries and would like to have hers evaluated  She states that she often feels she may have a headache or may feel faint and wonders if this is related  She states these symptoms have been going on for months  She states that she does not have neck pains and just worries because she is alone most of the time and is over the age of 72       06/03/2022    1:24 PM 04/30/2022    2:01 PM 04/25/2021    1:33 PM  Depression screen PHQ 2/9  Decreased Interest 1 0 1  Down, Depressed, Hopeless 0 0 1  PHQ - 2 Score 1 0 2  Altered sleeping 1 0 0  Tired, decreased energy 0 0 0  Change in appetite 0 0 2  Feeling bad or failure about yourself  0 0 0  Trouble concentrating 3 3 0  Moving slowly or fidgety/restless 1 0 0  Suicidal thoughts 0 0 0  PHQ-9 Score '6 3 4  '$ Difficult doing work/chores Not difficult at all Not difficult at all Somewhat difficult     -----------------------------------------------------------------------------------------   Medications: Outpatient Medications Prior to Visit  Medication Sig   Calcium-Vitamin D-Vitamin K 500-100-40 MG-UNT-MCG CHEW Chew by mouth. Calcium and Vitamin K   DILT-XR 180 MG 24 hr capsule TAKE 2 CAPSULES BY MOUTH EVERY DAY   gabapentin (NEURONTIN) 100 MG capsule TAKE 1 CAPSULE BY MOUTH AT BEDTIME   meloxicam (MOBIC) 7.5 MG tablet Mobic 7.5 mg tablet  Take 1 tablet twice a day by oral route.   rosuvastatin (CRESTOR) 10 MG tablet TAKE 1 TABLET BY MOUTH DAILY   traZODone (DESYREL) 50 MG tablet TAKE ONE TABLET BY MOUTH EVERY NIGHT AT BEDTIME   valACYclovir (VALTREX) 500 MG tablet As needed   [DISCONTINUED] desvenlafaxine (PRISTIQ) 50 MG 24 hr tablet Take 1 tablet (50 mg total) by mouth daily.   [DISCONTINUED] desvenlafaxine (PRISTIQ) 25 MG 24 hr tablet Take 1 tablet (25 mg total) by mouth daily.   [DISCONTINUED] loratadine (CLARITIN) 10 MG tablet Take 10 mg by mouth daily.   [DISCONTINUED] TURMERIC-GINGER PO Take by mouth. chewables   No facility-administered medications prior to visit.    Review of Systems     Objective    BP 132/81 (BP Location: Left Arm, Patient Position: Sitting, Cuff Size: Normal)   Pulse 73   Resp 16   Wt 141 lb 3.2 oz (64 kg)   BMI 25.01 kg/m  Physical Exam Vitals reviewed.  Constitutional:      General: She is not in acute distress.    Appearance: Normal appearance. She is not ill-appearing, toxic-appearing or diaphoretic.  Eyes:     Conjunctiva/sclera: Conjunctivae normal.  Neck:     Vascular: No carotid bruit.  Cardiovascular:     Rate and Rhythm: Normal rate and regular rhythm.     Pulses: Normal pulses.     Heart sounds: Normal heart sounds. No murmur heard.    No friction rub. No gallop.  Pulmonary:     Effort: Pulmonary effort is normal. No respiratory distress.     Breath sounds: Normal breath sounds. No stridor. No wheezing, rhonchi or  rales.  Abdominal:     General: Bowel sounds are normal. There is no distension.     Palpations: Abdomen is soft.     Tenderness: There is no abdominal tenderness.  Musculoskeletal:     Cervical back: Normal range of motion and neck supple. No edema, erythema, rigidity or crepitus. No muscular tenderness. Normal range of motion.     Right lower leg: No edema.     Left lower leg: No edema.  Skin:    Findings: No erythema or rash.  Neurological:     Mental Status: She is alert and oriented to person, place, and time.      No results found for any visits on 06/03/22.  Assessment & Plan     Problem List Items Addressed This Visit       Other   Clinical depression - Primary    Chronic  Stable on taper of pristiq  Will decrease to '50mg'$  daily of pristiq  Patient will follow up in one month         Relevant Medications   desvenlafaxine (PRISTIQ) 50 MG 24 hr tablet   Dizziness, nonspecific    Acute  Stable  VSS during exam today  Patient specifically concerned for carotid dz  Ordered carotid vascular US due to hx of HA and feelings of dizziness  No bruit heard on exam today        Relevant Orders   VAS US CAROTID     Return in about 4 weeks (around 07/01/2022) for mood f/u,carotid US .        The entirety of the information documented in the History of Present Illness, Review of Systems and Physical Exam were personally obtained by me. Portions of this information were initially documented by Lyndel Pleasure, CMA and reviewed by me for thoroughness and accuracy.Eulis Faraone, MD     Eulis Seth, MD  [facility] [phone] (phone) [phone] (fax)  [place]

## 2022-06-03 ENCOUNTER — Encounter: Payer: Self-pay | Admitting: Family Medicine

## 2022-06-03 ENCOUNTER — Ambulatory Visit (INDEPENDENT_AMBULATORY_CARE_PROVIDER_SITE_OTHER): Payer: PPO | Admitting: Family Medicine

## 2022-06-03 VITALS — BP 132/81 | HR 73 | Resp 16 | Wt 141.2 lb

## 2022-06-03 DIAGNOSIS — F3341 Major depressive disorder, recurrent, in partial remission: Secondary | ICD-10-CM | POA: Diagnosis not present

## 2022-06-03 DIAGNOSIS — R42 Dizziness and giddiness: Secondary | ICD-10-CM | POA: Diagnosis not present

## 2022-06-03 MED ORDER — DESVENLAFAXINE SUCCINATE ER 50 MG PO TB24: 50.0000 mg | ORAL_TABLET | Freq: Every day | ORAL | 1 refills | Status: DC

## 2022-06-03 NOTE — Assessment & Plan Note (Addendum)
Chronic  Stable on taper of pristiq  Will decrease to '50mg'$  daily of pristiq  Patient will follow up in one month

## 2022-06-03 NOTE — Assessment & Plan Note (Signed)
Acute  Stable  VSS during exam today  Patient specifically concerned for carotid dz  Ordered carotid vascular US due to hx of HA and feelings of dizziness  No bruit heard on exam today

## 2022-06-03 NOTE — Patient Instructions (Addendum)
Gladys Damme, MD is the physician for the Suboxone  I have ordered the ultrasound of your carotid arteries. Please be on the lookout for a call to schedule this imaging of your neck.   Today we will be changing you to the Pristiq '50mg'$  daily and discontinue the additional '25mg'$  daily.   Please follow up with me in 4 weeks.

## 2022-06-17 ENCOUNTER — Ambulatory Visit (INDEPENDENT_AMBULATORY_CARE_PROVIDER_SITE_OTHER): Payer: Self-pay

## 2022-06-17 DIAGNOSIS — R42 Dizziness and giddiness: Secondary | ICD-10-CM

## 2022-06-18 DIAGNOSIS — M79672 Pain in left foot: Secondary | ICD-10-CM | POA: Diagnosis not present

## 2022-06-18 DIAGNOSIS — M7742 Metatarsalgia, left foot: Secondary | ICD-10-CM | POA: Diagnosis not present

## 2022-06-25 ENCOUNTER — Telehealth: Payer: Self-pay

## 2022-06-25 NOTE — Telephone Encounter (Signed)
Patient says Teresa Duarte called on 06/19/22 and left a message to call for her imaging results. I advised I don't see any current results, but after patient hung up I found the carotid imaging results she was referring to.  Pt given results per notes of Dr. Quentin Cornwall on 06/19/22. Pt verbalized understanding.    Teresa Enriques, MD 06/19/2022  4:11 PM EST   Patient had normal carotid ultrasounds demonstrating normal blood flow without blockages

## 2022-07-01 NOTE — Progress Notes (Unsigned)
I,Teresa Duarte,acting as a scribe for [employer], MD.,have documented all relevant documentation on the behalf of Teresa Battenfield, MD,as directed by  Teresa Edmister, MD while in the presence of Teresa Lenzo, MD.   Established patient visit   Patient: Teresa Duarte   DOB: [birth year]   72 y.o. Female  MRN: [id number] Visit Date: 07/02/2022  Today's healthcare provider: [name], MD   Chief Complaint  Patient presents with   follow-up depression   Subjective    HPI   Depression, Follow-up  She  was last seen for this 1 months ago. Changes made at last visit include continue with pristiq taper last dose was '50mg'$  daily.   She reports excellent compliance with treatment. She is not having side effects.   She reports excellent tolerance of treatment. Current symptoms include: depressed mood, difficulty concentrating, and insomnia She feels she is Improved since last visit.     07/02/2022    1:07 PM 06/03/2022    1:24 PM 04/30/2022    2:01 PM  Depression screen PHQ 2/9  Decreased Interest 1 1 0  Down, Depressed, Hopeless 1 0 0  PHQ - 2 Score 2 1 0  Altered sleeping 2 1 0  Tired, decreased energy 1 0 0  Change in appetite 1 0 0  Feeling bad or failure about yourself  1 0 0  Trouble concentrating '2 3 3  '$ Moving slowly or fidgety/restless 0 1 0  Suicidal thoughts 0 0 0  PHQ-9 Score '9 6 3  '$ Difficult doing work/chores Not difficult at all Not difficult at all Not difficult at all    -----------------------------------------------------------------------------------------   Medications: Outpatient Medications Prior to Visit  Medication Sig   Calcium-Vitamin D-Vitamin K 500-100-40 MG-UNT-MCG CHEW Chew by mouth. Calcium and Vitamin K   desvenlafaxine (PRISTIQ) 50 MG 24 hr tablet Take 1 tablet (50 mg total) by mouth daily.   DILT-XR 180 MG 24 hr capsule TAKE 2 CAPSULES BY MOUTH EVERY DAY   gabapentin (NEURONTIN) 100  MG capsule TAKE 1 CAPSULE BY MOUTH AT BEDTIME   meloxicam (MOBIC) 7.5 MG tablet Mobic 7.5 mg tablet  Take 1 tablet twice a day by oral route.   rosuvastatin (CRESTOR) 10 MG tablet TAKE 1 TABLET BY MOUTH DAILY   traZODone (DESYREL) 50 MG tablet TAKE ONE TABLET BY MOUTH EVERY NIGHT AT BEDTIME   valACYclovir (VALTREX) 500 MG tablet As needed   No facility-administered medications prior to visit.    Review of Systems     Objective    BP 114/70 (BP Location: Left Arm, Patient Position: Sitting, Cuff Size: Normal)   Pulse 75   Temp 97.8 F (36.6 C) (Oral)   Resp 16   Wt 142 lb 8 oz (64.6 kg)   BMI 25.24 kg/m    Physical Exam Vitals reviewed.  Constitutional:      General: She is not in acute distress.    Appearance: Normal appearance. She is not ill-appearing, toxic-appearing or diaphoretic.  Eyes:     Conjunctiva/sclera: Conjunctivae normal.  Neck:     Thyroid: No thyroid mass, thyromegaly or thyroid tenderness.     Vascular: No carotid bruit.  Cardiovascular:     Rate and Rhythm: Normal rate and regular rhythm.     Pulses: Normal pulses.     Heart sounds: Normal heart sounds. No murmur heard.    No friction rub. No gallop.  Pulmonary:     Effort: Pulmonary effort is normal. No respiratory distress.  Breath sounds: Normal breath sounds. No stridor. No wheezing, rhonchi or rales.  Abdominal:     General: Bowel sounds are normal. There is no distension.     Palpations: Abdomen is soft.     Tenderness: There is no abdominal tenderness.  Musculoskeletal:     Right lower leg: No edema.     Left lower leg: No edema.  Lymphadenopathy:     Cervical: No cervical adenopathy.  Skin:    Findings: No erythema or rash.  Neurological:     Mental Status: She is alert and oriented to person, place, and time.       No results found for any visits on 07/02/22.  Assessment & Plan     Problem List Items Addressed This Visit       Other   Clinical depression - Primary     Chronic  Stable  Discussed increase GAD scoring, patient does not want to continue taper  Will continue pristiq '50mg'$  daily         No follow-ups on file.        The entirety of the information documented in the History of Present Illness, Review of Systems and Physical Exam were personally obtained by me. Portions of this information were initially documented by Teresa Duarte, CMA . I, Teresa Jubb, MD have reviewed the documentation above for thoroughness and accuracy.      Teresa Mcwhirter, MD  [facility] [phone] (phone) [phone] (fax)  [place]

## 2022-07-02 ENCOUNTER — Ambulatory Visit (INDEPENDENT_AMBULATORY_CARE_PROVIDER_SITE_OTHER): Payer: PPO | Admitting: Family Medicine

## 2022-07-02 ENCOUNTER — Encounter: Payer: Self-pay | Admitting: Family Medicine

## 2022-07-02 VITALS — BP 114/70 | HR 75 | Temp 97.8°F | Resp 16 | Wt 142.5 lb

## 2022-07-02 DIAGNOSIS — F3341 Major depressive disorder, recurrent, in partial remission: Secondary | ICD-10-CM | POA: Diagnosis not present

## 2022-07-02 NOTE — Assessment & Plan Note (Signed)
Chronic  Stable  Discussed increase GAD scoring, patient does not want to continue taper  Will continue pristiq '50mg'$  daily

## 2022-08-01 ENCOUNTER — Other Ambulatory Visit: Payer: Self-pay | Admitting: Family Medicine

## 2022-08-01 DIAGNOSIS — E7800 Pure hypercholesterolemia, unspecified: Secondary | ICD-10-CM

## 2022-10-10 ENCOUNTER — Other Ambulatory Visit: Payer: Self-pay | Admitting: Family Medicine

## 2022-11-11 ENCOUNTER — Encounter: Payer: Self-pay | Admitting: Family Medicine

## 2022-12-03 ENCOUNTER — Other Ambulatory Visit: Payer: Self-pay | Admitting: Family Medicine

## 2022-12-03 DIAGNOSIS — I10 Essential (primary) hypertension: Secondary | ICD-10-CM

## 2022-12-03 NOTE — Telephone Encounter (Signed)
Medication Refill - Medication: DILT-XR 180 MG 24 hr capsule   Has the patient contacted their pharmacy? Yes.     Preferred Pharmacy (with phone number or street name):  [facility] - [place], [place] - [address] Phone: [phone]  Fax: [phone]      Has the patient been seen for an appointment in the last year OR does the patient have an upcoming appointment? Yes.    Please assist patient further as she only has a few pills left.

## 2022-12-04 MED ORDER — DILTIAZEM HCL ER 180 MG PO CP24: 360.0000 mg | ORAL_CAPSULE | Freq: Every day | ORAL | 3 refills | Status: DC

## 2022-12-04 NOTE — Telephone Encounter (Signed)
Requested Prescriptions  Pending Prescriptions Disp Refills   diltiazem (DILT-XR) 180 MG 24 hr capsule 180 capsule 3    Sig: Take 2 capsules (360 mg total) by mouth daily.     Cardiovascular: Calcium Channel Blockers 3 Passed - 12/03/2022  1:44 PM      Passed - ALT in normal range and within 360 days    ALT  Date Value Ref Range Status  04/30/2022 19 0 - 32 IU/L Final         Passed - AST in normal range and within 360 days    AST  Date Value Ref Range Status  04/30/2022 26 0 - 40 IU/L Final         Passed - Cr in normal range and within 360 days    Creatinine, Ser  Date Value Ref Range Status  04/30/2022 0.80 0.57 - 1.00 mg/dL Final         Passed - Last BP in normal range    BP Readings from Last 1 Encounters:  07/02/22 114/70         Passed - Last Heart Rate in normal range    Pulse Readings from Last 1 Encounters:  07/02/22 75         Passed - Valid encounter within last 6 months    Recent Outpatient Visits           5 months ago Recurrent major depressive disorder, in partial remission (HCC)   [place] [facility] Simmons-Robinson, [place], MD   6 months ago Recurrent major depressive disorder, in partial remission (HCC)   [place] [facility] Simmons-Robinson, [place], MD   7 months ago Encounter for subsequent annual wellness visit (AWV) in Medicare patient   [place] [facility] [place], [place], MD   1 year ago Acute pain of left knee   [facility] Bosie Clos, MD   1 year ago Left leg pain    [facility] Jacky Kindle, [place]

## 2022-12-12 ENCOUNTER — Other Ambulatory Visit: Payer: Self-pay | Admitting: Family Medicine

## 2022-12-12 DIAGNOSIS — Z1231 Encounter for screening mammogram for malignant neoplasm of breast: Secondary | ICD-10-CM

## 2022-12-25 ENCOUNTER — Ambulatory Visit
Admission: RE | Admit: 2022-12-25 | Discharge: 2022-12-25 | Disposition: A | Discharge status: Home / Self Care | Payer: PPO | Source: Ambulatory Visit | Attending: Family Medicine | Admitting: Family Medicine

## 2022-12-25 DIAGNOSIS — Z1231 Encounter for screening mammogram for malignant neoplasm of breast: Secondary | ICD-10-CM | POA: Insufficient documentation

## 2023-01-27 ENCOUNTER — Other Ambulatory Visit: Payer: Self-pay | Admitting: Family Medicine

## 2023-01-27 DIAGNOSIS — F3341 Major depressive disorder, recurrent, in partial remission: Secondary | ICD-10-CM

## 2023-01-30 DIAGNOSIS — L578 Other skin changes due to chronic exposure to nonionizing radiation: Secondary | ICD-10-CM | POA: Diagnosis not present

## 2023-01-30 DIAGNOSIS — L821 Other seborrheic keratosis: Secondary | ICD-10-CM | POA: Diagnosis not present

## 2023-01-30 DIAGNOSIS — D2261 Melanocytic nevi of right upper limb, including shoulder: Secondary | ICD-10-CM | POA: Diagnosis not present

## 2023-01-30 DIAGNOSIS — D2272 Melanocytic nevi of left lower limb, including hip: Secondary | ICD-10-CM | POA: Diagnosis not present

## 2023-01-30 DIAGNOSIS — L570 Actinic keratosis: Secondary | ICD-10-CM | POA: Diagnosis not present

## 2023-01-30 DIAGNOSIS — D2271 Melanocytic nevi of right lower limb, including hip: Secondary | ICD-10-CM | POA: Diagnosis not present

## 2023-01-30 DIAGNOSIS — D2262 Melanocytic nevi of left upper limb, including shoulder: Secondary | ICD-10-CM | POA: Diagnosis not present

## 2023-01-30 DIAGNOSIS — D225 Melanocytic nevi of trunk: Secondary | ICD-10-CM | POA: Diagnosis not present

## 2023-01-30 DIAGNOSIS — Z808 Family history of malignant neoplasm of other organs or systems: Secondary | ICD-10-CM | POA: Diagnosis not present

## 2023-02-03 ENCOUNTER — Ambulatory Visit (INDEPENDENT_AMBULATORY_CARE_PROVIDER_SITE_OTHER): Payer: PPO | Admitting: Family Medicine

## 2023-02-03 ENCOUNTER — Other Ambulatory Visit: Payer: Self-pay | Admitting: Family Medicine

## 2023-02-03 ENCOUNTER — Encounter: Payer: Self-pay | Admitting: Family Medicine

## 2023-02-03 ENCOUNTER — Ambulatory Visit: Payer: PPO | Attending: Family Medicine

## 2023-02-03 VITALS — BP 142/82 | HR 73 | Temp 98.9°F | Ht 63.0 in | Wt 143.0 lb

## 2023-02-03 DIAGNOSIS — E7800 Pure hypercholesterolemia, unspecified: Secondary | ICD-10-CM

## 2023-02-03 DIAGNOSIS — G2581 Restless legs syndrome: Secondary | ICD-10-CM

## 2023-02-03 DIAGNOSIS — F1020 Alcohol dependence, uncomplicated: Secondary | ICD-10-CM

## 2023-02-03 DIAGNOSIS — R002 Palpitations: Secondary | ICD-10-CM | POA: Diagnosis not present

## 2023-02-03 MED ORDER — GABAPENTIN 100 MG PO CAPS
100.0000 mg | ORAL_CAPSULE | Freq: Every day | ORAL | 1 refills | Status: DC
Start: 2023-02-03 — End: 2023-09-05

## 2023-02-03 MED ORDER — ROSUVASTATIN CALCIUM 10 MG PO TABS
10.0000 mg | ORAL_TABLET | Freq: Every day | ORAL | 3 refills | Status: DC
Start: 2023-02-03 — End: 2024-02-06

## 2023-02-03 NOTE — Assessment & Plan Note (Signed)
Reports of nocturnal palpitations lasting for a few seconds upon waking up. No associated chest pain, lightheadedness, or dizziness. No prior history of similar episodes. Acute new intermittent problem  -Order a 14-day Zio patch monitor to evaluate the nature of these palpitations. -Check thyroid levels as hyperthyroidism can cause palpitations.Check CBC and CMP

## 2023-02-03 NOTE — Assessment & Plan Note (Signed)
Chronic  Lab Results  Component Value Date   CHOL 217 (H) 04/25/2021   HDL 79 04/25/2021   LDLCALC 122 (H) 04/25/2021   LDLDIRECT 99 04/30/2022   TRIG 93 04/25/2021   CHOLHDL 2.7 04/25/2021   Refills provided for crestor 10mg  daily, continue crestor 10mg 

## 2023-02-03 NOTE — Assessment & Plan Note (Signed)
Reports of consuming 3-4 glasses of wine nightly for the past 20 years. -Advise to reduce alcohol intake to no more than one drink per day to decrease strain on the heart and potential risk of heart failure.

## 2023-02-03 NOTE — Patient Instructions (Signed)
VISIT SUMMARY:  During your visit, we discussed your concerns about the 'flutters' you experience at night, your alcohol consumption, and your need for medication refills. We also talked about your occasional stomach discomfort with Meloxicam use and your difficulty with sleep.  YOUR PLAN:  -PALPITATIONS: You've been experiencing a rapid heartbeat at night. To understand this better, we're going to use a 14-day Zio patch monitor. This device will record your heart's activity for [place] to review. We'll also check your thyroid levels, as an overactive thyroid can sometimes cause these symptoms.  -ALCOHOL USE: You've mentioned that you consume 3-4 glasses of wine nightly. It's recommended to reduce this to no more than one drink per day. This can help lessen the strain on your heart and reduce the risk of heart-related issues.  -MEDICATION REFILLS: Your Gabapentin and Crestor medications need refills. We'll ensure these are refilled for you.  INSTRUCTIONS:  Please return in 4 weeks so we can review the results of the Zio patch monitor and your lab results. In the meantime, please start reducing your alcohol intake and continue taking your medications as prescribed.

## 2023-02-03 NOTE — Progress Notes (Signed)
Established patient visit   Patient: Teresa Duarte   DOB: 1950-12-13   72 y.o. Female  MRN: [id number] Visit Date: 02/03/2023  Today's healthcare provider: Ronnald Ramp, MD   Chief Complaint  Patient presents with   Heart flutters    Patient states every once in a while she feels like her heart flutters.  It only happens at night when she is asleep and only lasts a couple minutes.  She thinks it has something to do with her arm being asleep and her trying to roll over.   Subjective     HPI     Heart flutters    Additional comments: Patient states every once in a while she feels like her heart flutters.  It only happens at night when she is asleep and only lasts a couple minutes.  She thinks it has something to do with her arm being asleep and her trying to roll over.      Last edited by Adline Peals, CMA on 02/03/2023  1:40 PM.       Discussed the use of AI scribe software for clinical note transcription with the patient, who gave verbal consent to proceed.  History of Present Illness   The patient presents with a chief complaint of "flutters" that occur at night, lasting approximately five seconds. These episodes are described as a sensation of the heart beating rapidly, which ceases when the patient repositions to lie on her back. The patient denies any associated pain, lightheadedness, dizziness, or nausea. There is no prior history of similar episodes.  The patient's daily caffeine intake is limited to one cup of coffee in the morning and occasional soda consumption. However, she reports a long-standing habit of consuming three to four glasses of wine nightly for the past twenty years.  The patient reports occasional stomach discomfort with Meloxicam use.  The patient also mentions difficulty with sleep, describing an inability to "shut off" her mind at night. This issue is not alleviated by her current medication, Pristiq. The patient also reports a  history of a "broken" heart, likely referring to emotional distress rather than a physical cardiac condition.  The patient has a known heart murmur, which has been present for over fifty years. No other significant past medical history is mentioned in the conversation.      Last EKG 04/08/18 QTC 430, normal intervals, no ST elevation, normal rate and SR   Past Medical History:  Diagnosis Date   Anxiety    Arthritis    Chronic kidney disease    ?spot on her left kidney   Depression    HBP (high blood pressure)    Heart murmur    doesn't give her any problems   High cholesterol    PONV (postoperative nausea and vomiting)     Medications: Outpatient Medications Prior to Visit  Medication Sig   Calcium-Vitamin D-Vitamin K 500-100-40 MG-UNT-MCG CHEW Chew by mouth. Calcium and Vitamin K   desvenlafaxine (PRISTIQ) 50 MG 24 hr tablet Take 1 tablet (50 mg total) by mouth daily.   diltiazem (DILT-XR) 180 MG 24 hr capsule Take 2 capsules (360 mg total) by mouth daily.   meloxicam (MOBIC) 7.5 MG tablet Mobic 7.5 mg tablet  Take 1 tablet twice a day by oral route.   traZODone (DESYREL) 50 MG tablet TAKE ONE TABLET BY MOUTH EVERY EVENING AT BEDTIME   valACYclovir (VALTREX) 500 MG tablet As needed   [DISCONTINUED] gabapentin (NEURONTIN) 100 MG capsule  TAKE 1 CAPSULE BY MOUTH AT BEDTIME   [DISCONTINUED] rosuvastatin (CRESTOR) 10 MG tablet TAKE 1 TABLET BY MOUTH DAILY   No facility-administered medications prior to visit.    Review of Systems  Last CBC Lab Results  Component Value Date   WBC 5.3 04/25/2021   HGB 13.3 04/25/2021   HCT 39.7 04/25/2021   MCV 96 04/25/2021   MCH 32.0 04/25/2021   RDW 12.4 04/25/2021   PLT 220 04/25/2021   Last metabolic panel Lab Results  Component Value Date   GLUCOSE 142 (H) 04/30/2022   NA 141 04/30/2022   K 4.2 04/30/2022   CL 101 04/30/2022   CO2 27 04/30/2022   BUN 18 04/30/2022   CREATININE 0.80 04/30/2022   EGFR 79 04/30/2022    CALCIUM 9.9 04/30/2022   PROT 6.4 04/30/2022   ALBUMIN 4.5 04/30/2022   LABGLOB 1.9 04/30/2022   AGRATIO 2.4 (H) 04/30/2022   BILITOT 0.3 04/30/2022   ALKPHOS 76 04/30/2022   AST 26 04/30/2022   ALT 19 04/30/2022   ANIONGAP 14 02/22/2014   Last hemoglobin A1c Lab Results  Component Value Date   HGBA1C 5.6 04/30/2022   Last thyroid functions Lab Results  Component Value Date   TSH 1.640 05/05/2019        Objective    BP (!) 142/82 (BP Location: Left Arm, Patient Position: Sitting, Cuff Size: Normal)   Pulse 73   Temp 98.9 F (37.2 C) (Oral)   Ht 5\' 3"  (1.6 m)   Wt 143 lb (64.9 kg)   SpO2 97%   BMI 25.33 kg/m   BP Readings from Last 3 Encounters:  02/03/23 (!) 142/82  07/02/22 114/70  06/03/22 132/81   Wt Readings from Last 3 Encounters:  02/03/23 143 lb (64.9 kg)  07/02/22 142 lb 8 oz (64.6 kg)  06/03/22 141 lb 3.2 oz (64 kg)       Physical Exam  Physical Exam   NECK: Thyroid gland normal. CARDIOVASCULAR: Heart murmur noted. Heart rate normal, no arrhythmias detected on auscultation.       No results found for any visits on 02/03/23.  Assessment & Plan     Problem List Items Addressed This Visit     Alcoholic (HCC)    Reports of consuming 3-4 glasses of wine nightly for the past 20 years. -Advise to reduce alcohol intake to no more than one drink per day to decrease strain on the heart and potential risk of heart failure.      Fluttering sensation of heart - Primary    Reports of nocturnal palpitations lasting for a few seconds upon waking up. No associated chest pain, lightheadedness, or dizziness. No prior history of similar episodes. Acute new intermittent problem  -Order a 14-day Zio patch monitor to evaluate the nature of these palpitations. -Check thyroid levels as hyperthyroidism can cause palpitations.Check CBC and CMP       Relevant Orders   LONG TERM MONITOR (3-14 DAYS)   TSH+T4F+T3Free   CBC   Comprehensive metabolic panel    Hypercholesteremia    Chronic  Lab Results  Component Value Date   CHOL 217 (H) 04/25/2021   HDL 79 04/25/2021   LDLCALC 122 (H) 04/25/2021   LDLDIRECT 99 04/30/2022   TRIG 93 04/25/2021   CHOLHDL 2.7 04/25/2021   Refills provided for crestor 10mg  daily, continue crestor 10mg        Relevant Medications   rosuvastatin (CRESTOR) 10 MG tablet   Other Visit Diagnoses  Restless leg       Relevant Medications   gabapentin (NEURONTIN) 100 MG capsule         Medication Refills Gabapentin 100mg  at bedtime and Crestor 10mg  for cholesterol need refills. -Refill Gabapentin 100mg  at bedtime and Crestor 10mg  for cholesterol.    Return in about 4 weeks (around 03/03/2023) for palpitations.         Ronnald Ramp, MD  [facility] [phone] (phone) [phone] (fax)  [facility]

## 2023-02-04 LAB — COMPREHENSIVE METABOLIC PANEL
ALT: 13 [IU]/L (ref 0–32)
AST: 27 [IU]/L (ref 0–40)
Albumin: 4.9 g/dL — ABNORMAL HIGH (ref 3.8–4.8)
Alkaline Phosphatase: 76 [IU]/L (ref 44–121)
BUN/Creatinine Ratio: 19 (ref 12–28)
BUN: 13 mg/dL (ref 8–27)
Bilirubin Total: 0.6 mg/dL (ref 0.0–1.2)
CO2: 24 mmol/L (ref 20–29)
Calcium: 10.1 mg/dL (ref 8.7–10.3)
Chloride: 98 mmol/L (ref 96–106)
Creatinine, Ser: 0.7 mg/dL (ref 0.57–1.00)
Globulin, Total: 2.1 g/dL (ref 1.5–4.5)
Glucose: 131 mg/dL — ABNORMAL HIGH (ref 70–99)
Potassium: 4.5 mmol/L (ref 3.5–5.2)
Sodium: 140 mmol/L (ref 134–144)
Total Protein: 7 g/dL (ref 6.0–8.5)
eGFR: 92 mL/min/{1.73_m2} (ref >59)

## 2023-02-04 LAB — CBC
Hematocrit: 41.4 % (ref 34.0–46.6)
Hemoglobin: 14.1 g/dL (ref 11.1–15.9)
MCH: 32.1 pg (ref 26.6–33.0)
MCHC: 34.1 g/dL (ref 31.5–35.7)
MCV: 94 fL (ref 79–97)
Platelets: 217 10*3/uL (ref 150–450)
RBC: 4.39 x10E6/uL (ref 3.77–5.28)
RDW: 12.1 % (ref 11.7–15.4)
WBC: 5.5 10*3/uL (ref 3.4–10.8)

## 2023-02-04 LAB — TSH+T4F+T3FREE
Free T4: 1.15 ng/dL (ref 0.82–1.77)
T3, Free: 2.6 pg/mL (ref 2.0–4.4)
TSH: 1.38 u[IU]/mL (ref 0.450–4.500)

## 2023-02-05 DIAGNOSIS — R002 Palpitations: Secondary | ICD-10-CM | POA: Diagnosis not present

## 2023-02-26 DIAGNOSIS — R002 Palpitations: Secondary | ICD-10-CM | POA: Diagnosis not present

## 2023-02-27 ENCOUNTER — Other Ambulatory Visit: Payer: Self-pay | Admitting: Family Medicine

## 2023-02-27 DIAGNOSIS — F3341 Major depressive disorder, recurrent, in partial remission: Secondary | ICD-10-CM

## 2023-02-27 NOTE — Telephone Encounter (Signed)
Please advise 

## 2023-03-03 ENCOUNTER — Ambulatory Visit (INDEPENDENT_AMBULATORY_CARE_PROVIDER_SITE_OTHER): Payer: PPO | Admitting: Family Medicine

## 2023-03-03 ENCOUNTER — Encounter: Payer: Self-pay | Admitting: Family Medicine

## 2023-03-03 VITALS — BP 128/72 | HR 69 | Ht 63.0 in | Wt 140.0 lb

## 2023-03-03 DIAGNOSIS — R002 Palpitations: Secondary | ICD-10-CM

## 2023-03-03 DIAGNOSIS — F3341 Major depressive disorder, recurrent, in partial remission: Secondary | ICD-10-CM | POA: Diagnosis not present

## 2023-03-03 MED ORDER — DESVENLAFAXINE SUCCINATE ER 50 MG PO TB24
50.0000 mg | ORAL_TABLET | Freq: Every day | ORAL | 2 refills | Status: DC
Start: 2023-03-03 — End: 2023-11-18

## 2023-03-03 NOTE — Assessment & Plan Note (Signed)
Palpitations Cardiac monitoring showed 26 occurrences of SVT, longest being 13 beats, and rare supraventricular and ventricular ectopy. No sustained arrhythmias such as atrial fibrillation. Patient reports decreased frequency of palpitations since reducing alcohol consumption. -Refer to cardiology for further evaluation and potential echocardiogram.

## 2023-03-03 NOTE — Assessment & Plan Note (Signed)
  Chronic, stable on regimen  Controlled on Pristiq 50mg  daily. -Refill Pristiq 50mg  daily for 90 days with additional refills.

## 2023-03-03 NOTE — Progress Notes (Signed)
Established patient visit   Patient: Teresa Duarte   DOB: 1950-07-31   72 y.o. Female  MRN: [id number] Visit Date: 03/03/2023  Today's healthcare provider: Ronnald Ramp, MD   Chief Complaint  Patient presents with   Medical Management of Chronic Issues    4 week follow-up for palpitations. Patient reports they are a little better and she is working on not drinking as much.    Medication Refill    Patient is needing a 90 day refill on pristiq   Subjective     HPI     Medical Management of Chronic Issues    Additional comments: 4 week follow-up for palpitations. Patient reports they are a little better and she is working on not drinking as much.         Medication Refill    Additional comments: Patient is needing a 90 day refill on pristiq      Last edited by Acey Lav, CMA on 03/03/2023  1:28 PM.       Discussed the use of AI scribe software for clinical note transcription with the patient, who gave verbal consent to proceed.  History of Present Illness   Teresa Duarte, a 72 year old female with a history of depression, anxiety, and recent episodes of palpitations, presents for follow-up. She underwent two weeks of cardiac monitoring, which revealed 26 instances of supraventricular tachycardia (SVT), with the longest episode lasting 13 beats. No sustained arrhythmias such as atrial fibrillation were noted, and the patient was in sinus rhythm when the monitor was triggered. Rare supraventricular and ventricular ectopy were also observed.  The patient reports a decrease in alcohol consumption, which seems to have resulted in less frequent palpitations. Recent lab results showed normal electrolyte levels and liver enzymes. Her hemoglobin A1c was 5.6 in January of 2024, and she had a normal TSH of 1.38 last month.  The patient requested refills of Pristiq 50 mg once daily for her history of depression and anxiety.         Past Medical History:   Diagnosis Date   Anxiety    Arthritis    Chronic kidney disease    ?spot on her left kidney   Depression    HBP (high blood pressure)    Heart murmur    doesn't give her any problems   High cholesterol    PONV (postoperative nausea and vomiting)     Medications: Outpatient Medications Prior to Visit  Medication Sig   Calcium-Vitamin D-Vitamin K 500-100-40 MG-UNT-MCG CHEW Chew by mouth. Calcium and Vitamin K   diltiazem (DILT-XR) 180 MG 24 hr capsule Take 2 capsules (360 mg total) by mouth daily.   gabapentin (NEURONTIN) 100 MG capsule Take 1 capsule (100 mg total) by mouth at bedtime.   meloxicam (MOBIC) 7.5 MG tablet Mobic 7.5 mg tablet  Take 1 tablet twice a day by oral route.   rosuvastatin (CRESTOR) 10 MG tablet Take 1 tablet (10 mg total) by mouth daily.   traZODone (DESYREL) 50 MG tablet TAKE ONE TABLET BY MOUTH EVERY EVENING AT BEDTIME   valACYclovir (VALTREX) 500 MG tablet As needed   [DISCONTINUED] desvenlafaxine (PRISTIQ) 50 MG 24 hr tablet Take 1 tablet (50 mg total) by mouth daily.   No facility-administered medications prior to visit.    Review of Systems  Last CBC Lab Results  Component Value Date   WBC 5.5 02/03/2023   HGB 14.1 02/03/2023   HCT 41.4 02/03/2023   MCV 94 02/03/2023  MCH 32.1 02/03/2023   RDW 12.1 02/03/2023   PLT 217 02/03/2023   Last metabolic panel Lab Results  Component Value Date   GLUCOSE 131 (H) 02/03/2023   NA 140 02/03/2023   K 4.5 02/03/2023   CL 98 02/03/2023   CO2 24 02/03/2023   BUN 13 02/03/2023   CREATININE 0.70 02/03/2023   EGFR 92 02/03/2023   CALCIUM 10.1 02/03/2023   PROT 7.0 02/03/2023   ALBUMIN 4.9 (H) 02/03/2023   LABGLOB 2.1 02/03/2023   AGRATIO 2.4 (H) 04/30/2022   BILITOT 0.6 02/03/2023   ALKPHOS 76 02/03/2023   AST 27 02/03/2023   ALT 13 02/03/2023   ANIONGAP 14 02/22/2014   Last hemoglobin A1c Lab Results  Component Value Date   HGBA1C 5.6 04/30/2022   Last thyroid functions Lab Results   Component Value Date   TSH 1.380 02/03/2023        Objective    BP 128/72   Pulse 69   Ht 5\' 3"  (1.6 m)   Wt 140 lb (63.5 kg)   SpO2 100%   BMI 24.80 kg/m  BP Readings from Last 3 Encounters:  03/03/23 128/72  02/03/23 (!) 142/82  07/02/22 114/70   Wt Readings from Last 3 Encounters:  03/03/23 140 lb (63.5 kg)  02/03/23 143 lb (64.9 kg)  07/02/22 142 lb 8 oz (64.6 kg)          Physical Exam Constitutional:      General: She is not in acute distress.    Appearance: Normal appearance. She is normal weight. She is not ill-appearing, toxic-appearing or diaphoretic.     Comments: Well groomed, calmly sitting in exam rooom  Neurological:     Mental Status: She is alert.  Psychiatric:        Attention and Perception: Attention and perception normal. She is attentive. She does not perceive auditory or visual hallucinations.        Mood and Affect: Mood and affect normal.        Speech: Speech normal.        Behavior: Behavior normal. Behavior is cooperative.        Thought Content: Thought content normal. Thought content is not paranoid or delusional. Thought content does not include homicidal or suicidal ideation. Thought content does not include homicidal or suicidal plan.        Judgment: Judgment normal.      General: Alert, no acute distress Cardio: Normal S1 and S2, RRR, no r/m/g Pulm: CTAB, normal work of breathing   No results found for any visits on 03/03/23.  Assessment & Plan     Problem List Items Addressed This Visit       Other   Fluttering sensation of heart - Primary    Palpitations Cardiac monitoring showed 26 occurrences of SVT, longest being 13 beats, and rare supraventricular and ventricular ectopy. No sustained arrhythmias such as atrial fibrillation. Patient reports decreased frequency of palpitations since reducing alcohol consumption. -Refer to cardiology for further evaluation and potential echocardiogram.      Relevant Orders    Ambulatory referral to Cardiology   Affective disorder, major     Chronic, stable on regimen  Controlled on Pristiq 50mg  daily. -Refill Pristiq 50mg  daily for 90 days with additional refills.      Relevant Medications   desvenlafaxine (PRISTIQ) 50 MG 24 hr tablet           Return in about 6 weeks (around 04/14/2023) for AWV.  Ronnald Ramp, MD  [facility] [phone] (phone) [phone] (fax)  [facility]

## 2023-03-04 NOTE — Addendum Note (Signed)
Addended by: Bing Neighbors on: 03/04/2023 08:09 AM   Modules accepted: Orders

## 2023-03-05 ENCOUNTER — Telehealth: Payer: Self-pay

## 2023-03-05 NOTE — Telephone Encounter (Signed)
Reviewed, will await notes and recommendations

## 2023-03-05 NOTE — Telephone Encounter (Signed)
Copied from CRM [id number]. Topic: General - Other >> Mar 05, 2023 12:29 PM Turkey B wrote: Reason for CRM: pt called in to let Dr Sharol Harness know she has an appt with the cardiologist , Dr Darrold Junker  at [place] [facility],  on Dec 10 at 3:45

## 2023-04-08 DIAGNOSIS — R002 Palpitations: Secondary | ICD-10-CM | POA: Diagnosis not present

## 2023-04-08 DIAGNOSIS — I10 Essential (primary) hypertension: Secondary | ICD-10-CM | POA: Diagnosis not present

## 2023-04-08 DIAGNOSIS — E7800 Pure hypercholesterolemia, unspecified: Secondary | ICD-10-CM | POA: Diagnosis not present

## 2023-04-08 DIAGNOSIS — I4892 Unspecified atrial flutter: Secondary | ICD-10-CM | POA: Diagnosis not present

## 2023-05-20 ENCOUNTER — Encounter: Payer: Self-pay | Admitting: Family Medicine

## 2023-05-20 ENCOUNTER — Ambulatory Visit (INDEPENDENT_AMBULATORY_CARE_PROVIDER_SITE_OTHER): Payer: PPO | Admitting: Family Medicine

## 2023-05-20 ENCOUNTER — Ambulatory Visit: Payer: PPO

## 2023-05-20 VITALS — BP 138/88 | Ht 63.0 in | Wt 142.0 lb

## 2023-05-20 VITALS — BP 133/74 | HR 70 | Ht 63.0 in | Wt 146.0 lb

## 2023-05-20 DIAGNOSIS — F1011 Alcohol abuse, in remission: Secondary | ICD-10-CM

## 2023-05-20 DIAGNOSIS — F411 Generalized anxiety disorder: Secondary | ICD-10-CM

## 2023-05-20 DIAGNOSIS — Z0000 Encounter for general adult medical examination without abnormal findings: Secondary | ICD-10-CM

## 2023-05-20 DIAGNOSIS — M8580 Other specified disorders of bone density and structure, unspecified site: Secondary | ICD-10-CM

## 2023-05-20 DIAGNOSIS — I10 Essential (primary) hypertension: Secondary | ICD-10-CM

## 2023-05-20 DIAGNOSIS — H905 Unspecified sensorineural hearing loss: Secondary | ICD-10-CM | POA: Diagnosis not present

## 2023-05-20 DIAGNOSIS — E7800 Pure hypercholesterolemia, unspecified: Secondary | ICD-10-CM | POA: Diagnosis not present

## 2023-05-20 DIAGNOSIS — Z131 Encounter for screening for diabetes mellitus: Secondary | ICD-10-CM | POA: Diagnosis not present

## 2023-05-20 DIAGNOSIS — Z130 Encounter for screening for diseases of the blood and blood-forming organs and certain disorders involving the immune mechanism: Secondary | ICD-10-CM | POA: Diagnosis not present

## 2023-05-20 NOTE — Assessment & Plan Note (Signed)
[age]-year-old presenting for an annual physical examination. Declined Shingrix and updated COVID vaccines despite recommendation. Recommend 150 mins of exercise per week and well balanced diet  - Order hemoglobin A1c - Order complete metabolic panel - Order CBC - Order lipid panel

## 2023-05-20 NOTE — Patient Instructions (Addendum)
Teresa Duarte , Thank you for taking time to come for your Medicare Wellness Visit. I appreciate your ongoing commitment to your health goals. Please review the following plan we discussed and let me know if I can assist you in the future.   Referrals/Orders/Follow-Ups/Clinician Recommendations: NONE  This is a list of the screening recommended for you and due dates:  Health Maintenance  Topic Date Due   Zoster (Shingles) Vaccine (1 of 2) Never done   COVID-19 Vaccine (3 - 2024-25 season) 12/29/2022   Flu Shot  07/28/2023*   Mammogram  12/25/2023   DEXA scan (bone density measurement)  05/13/2024   Medicare Annual Wellness Visit  05/19/2024   Colon Cancer Screening  01/05/2031   Pneumonia Vaccine  Completed   Hepatitis C Screening  Completed   HPV Vaccine  Aged Out   DTaP/Tdap/Td vaccine  Discontinued  *Topic was postponed. The date shown is not the original due date.    Advanced directives: (ACP Link)Information on Advanced Care Planning can be found at [facility] Advance Health Care Directives Advance Health Care Directives ([email or web address]   Next Medicare Annual Wellness Visit scheduled for next year: Yes   05/25/24 @ 1:50 PM IN PERSON

## 2023-05-20 NOTE — Progress Notes (Signed)
Complete physical exam   Patient: Teresa Duarte   DOB: 21-Jun-1950   73 y.o. Female  MRN: [id number] Visit Date: 05/20/2023  Today's healthcare provider: Ronnald Ramp, MD   Chief Complaint  Patient presents with   Annual Exam   Subjective    Teresa Duarte is a 73 y.o. female who presents today for a complete physical exam.   She reports consuming a low fat and low sodium diet.   The patient does not participate in regular exercise at present.   She generally feels well.   She reports sleeping well.    She does not have additional problems to discuss today.   Discussed the use of AI scribe software for clinical note transcription with the patient, who gave verbal consent to proceed.  History of Present Illness   The patient, a 73 year old female with a history of essential hypertension, sensorineural deafness, osteopenia, hypercholesterolemia, depression, and anxiety, presents for an annual physical. The patient has recently made significant lifestyle changes, notably reducing alcohol consumption from a high level to approximately four glasses per week. The patient reports a positive change in health since reducing alcohol intake.  The patient has been following a low sodium and low-fat diet, incorporating more fruits into meals. Despite not engaging in formal exercise, the patient remains active throughout the day and has recently purchased a treadmill for additional physical activity.  The patient reports a change in urinary habits, experiencing urgency in the morning. Additionally, the patient notes a decrease in bowel movements since reducing alcohol consumption, now occurring every other day. The patient also reports a sensation when swallowing, described as a dry feeling.  The patient has been seen by a cardiologist at [facility] for atrial flutter and is following a DASH diet. The patient is due for a follow-up with the cardiologist in six months.         Past  Medical History:  Diagnosis Date   Anxiety    Arthritis    Chronic kidney disease    ?spot on her left kidney   Depression    HBP (high blood pressure)    Heart murmur    doesn't give her any problems   High cholesterol    PONV (postoperative nausea and vomiting)    Past Surgical History:  Procedure Laterality Date   ABDOMINAL HYSTERECTOMY     CHOLECYSTECTOMY N/A 03/03/2014   Procedure: LAPAROSCOPIC CHOLECYSTECTOMY WITH INTRAOPERATIVE CHOLANGIOGRAM;  Surgeon: Abigail Miyamoto, MD;  Location: [facility] OR;  Service: General;  Laterality: N/A;   COLONOSCOPY  2013   FOOT SURGERY Bilateral    SHOULDER SURGERY Right    TENDON REPAIR Left    tendon/ligament repair, pt not sure which   TONSILLECTOMY     Social History   Socioeconomic History   Marital status: Widowed    Spouse name: Not on file   Number of children: 2   Years of education: Not on file   Highest education level: GED or equivalent  Occupational History   Occupation: retired  Tobacco Use   Smoking status: Never   Smokeless tobacco: Never  Vaping Use   Vaping status: Never Used  Substance and Sexual Activity   Alcohol use: Yes    Alcohol/week: 21.0 standard drinks of alcohol    Types: 21 Glasses of wine per week    Comment: 3 glasses of wine nightly   Drug use: No   Sexual activity: Yes    Partners: Male    Comment: married, husband  has a hx of liver failurem alcohol and drug use with Hep C  Other Topics Concern   Not on file  Social History Narrative   Not on file   Social Drivers of Health   Financial Resource Strain: Low Risk  (05/20/2023)   Overall Financial Resource Strain (CARDIA)    Difficulty of Paying Living Expenses: Not hard at all  Food Insecurity: No Food Insecurity (05/20/2023)   Hunger Vital Sign    Worried About Running Out of Food in the Last Year: Never true    Ran Out of Food in the Last Year: Never true  Transportation Needs: No Transportation Needs (05/20/2023)   PRAPARE - Therapist, art (Medical): No    Lack of Transportation (Non-Medical): No  Physical Activity: Insufficiently Active (05/20/2023)   Exercise Vital Sign    Days of Exercise per Week: 4 days    Minutes of Exercise per Session: 20 min  Stress: Stress Concern Present (05/20/2023)   [employer] of Occupational Health - Occupational Stress Questionnaire    Feeling of Stress : To some extent  Social Connections: Socially Isolated (05/20/2023)   Social Connection and Isolation Panel [NHANES]    Frequency of Communication with Friends and Family: More than three times a week    Frequency of Social Gatherings with Friends and Family: Twice a week    Attends Religious Services: Never    Database administrator or Organizations: No    Attends Banker Meetings: Never    Marital Status: Widowed  Intimate Partner Violence: Not At Risk (05/20/2023)   Humiliation, Afraid, Rape, and Kick questionnaire    Fear of Current or Ex-Partner: No    Emotionally Abused: No    Physically Abused: No    Sexually Abused: No   Family Status  Relation Name Status   Mother  Alive   Father  Deceased   Sister 1 Alive   Son 1 Alive   Sister 2 Alive   Son 2 Alive   Neg Hx  (Not Specified)  No partnership data on file   Family History  Problem Relation Age of Onset   Hypertension Mother    Hypercholesterolemia Mother    Heart attack Father    Depression Sister    Depression Sister    Hypertension Sister    Hypercholesterolemia Sister    Depression Son    Anxiety disorder Son    Breast cancer Neg Hx    Allergies  Allergen Reactions   Other Other (See Comments)    Anesthesia caused nausea and vomiting about 20 years ago     Medications: Outpatient Medications Prior to Visit  Medication Sig   desvenlafaxine (PRISTIQ) 50 MG 24 hr tablet Take 1 tablet (50 mg total) by mouth daily.   diltiazem (DILT-XR) 180 MG 24 hr capsule Take 2 capsules (360 mg total) by mouth daily.    gabapentin (NEURONTIN) 100 MG capsule Take 1 capsule (100 mg total) by mouth at bedtime.   Multiple Vitamin (MULTIVITAMIN) tablet Take 1 tablet by mouth daily. GUMMIES - HAS 50MG  OF COLLAGEN   rosuvastatin (CRESTOR) 10 MG tablet Take 1 tablet (10 mg total) by mouth daily.   traZODone (DESYREL) 50 MG tablet TAKE ONE TABLET BY MOUTH EVERY EVENING AT BEDTIME   valACYclovir (VALTREX) 500 MG tablet As needed   No facility-administered medications prior to visit.    Review of Systems  Last CBC Lab Results  Component Value Date  WBC 5.5 02/03/2023   HGB 14.1 02/03/2023   HCT 41.4 02/03/2023   MCV 94 02/03/2023   MCH 32.1 02/03/2023   RDW 12.1 02/03/2023   PLT 217 02/03/2023   Last metabolic panel Lab Results  Component Value Date   GLUCOSE 131 (H) 02/03/2023   NA 140 02/03/2023   K 4.5 02/03/2023   CL 98 02/03/2023   CO2 24 02/03/2023   BUN 13 02/03/2023   CREATININE 0.70 02/03/2023   EGFR 92 02/03/2023   CALCIUM 10.1 02/03/2023   PROT 7.0 02/03/2023   ALBUMIN 4.9 (H) 02/03/2023   LABGLOB 2.1 02/03/2023   AGRATIO 2.4 (H) 04/30/2022   BILITOT 0.6 02/03/2023   ALKPHOS 76 02/03/2023   AST 27 02/03/2023   ALT 13 02/03/2023   ANIONGAP 14 02/22/2014   Last lipids Lab Results  Component Value Date   CHOL 217 (H) 04/25/2021   HDL 79 04/25/2021   LDLCALC 122 (H) 04/25/2021   LDLDIRECT 99 04/30/2022   TRIG 93 04/25/2021   CHOLHDL 2.7 04/25/2021   Last hemoglobin A1c Lab Results  Component Value Date   HGBA1C 5.6 04/30/2022   Last thyroid functions Lab Results  Component Value Date   TSH 1.380 02/03/2023       Objective    BP 133/74 (BP Location: Left Arm, Patient Position: Sitting, Cuff Size: Normal)   Pulse 70   Ht 5\' 3"  (1.6 m)   Wt 146 lb (66.2 kg)   SpO2 100%   BMI 25.86 kg/m  BP Readings from Last 3 Encounters:  05/20/23 133/74  05/20/23 138/88  03/03/23 128/72   Wt Readings from Last 3 Encounters:  05/20/23 146 lb (66.2 kg)  05/20/23 142 lb  (64.4 kg)  03/03/23 140 lb (63.5 kg)        Physical Exam Vitals reviewed.  Constitutional:      General: She is not in acute distress.    Appearance: Normal appearance. She is not ill-appearing, toxic-appearing or diaphoretic.  HENT:     Head: Normocephalic and atraumatic.     Right Ear: Tympanic membrane and external ear normal. There is no impacted cerumen.     Left Ear: Tympanic membrane and external ear normal. There is no impacted cerumen.     Nose: Nose normal.     Mouth/Throat:     Pharynx: Oropharynx is clear.  Eyes:     General: No scleral icterus.    Extraocular Movements: Extraocular movements intact.     Conjunctiva/sclera: Conjunctivae normal.     Pupils: Pupils are equal, round, and reactive to light.  Cardiovascular:     Rate and Rhythm: Normal rate and regular rhythm.     Pulses: Normal pulses.     Heart sounds: Normal heart sounds. No murmur heard.    No friction rub. No gallop.  Pulmonary:     Effort: Pulmonary effort is normal. No respiratory distress.     Breath sounds: Normal breath sounds. No wheezing, rhonchi or rales.  Abdominal:     General: Bowel sounds are normal. There is no distension.     Palpations: Abdomen is soft. There is no mass.     Tenderness: There is no abdominal tenderness. There is no guarding.  Musculoskeletal:        General: No deformity.     Cervical back: Normal range of motion and neck supple. No rigidity.     Right lower leg: No edema.     Left lower leg: No edema.  Lymphadenopathy:  Cervical: No cervical adenopathy.  Skin:    General: Skin is warm.     Capillary Refill: Capillary refill takes less than 2 seconds.     Findings: No erythema or rash.  Neurological:     General: No focal deficit present.     Mental Status: She is alert and oriented to person, place, and time.     Motor: No weakness.     Gait: Gait normal.  Psychiatric:        Mood and Affect: Mood normal.        Behavior: Behavior normal.        Last depression screening scores    05/20/2023    2:05 PM 07/02/2022    1:07 PM 06/03/2022    1:24 PM  PHQ 2/9 Scores  PHQ - 2 Score 0 2 1  PHQ- 9 Score 0 9 6    Last fall risk screening    05/20/2023    2:08 PM  Fall Risk   Falls in the past year? 0  Number falls in past yr: 0  Injury with Fall? 0  Risk for fall due to : No Fall Risks  Follow up Falls prevention discussed;Falls evaluation completed    Last Audit-C alcohol use screening    05/20/2023    2:05 PM  Alcohol Use Disorder Test (AUDIT)  1. How often do you have a drink containing alcohol? 3  2. How many drinks containing alcohol do you have on a typical day when you are drinking? 0  3. How often do you have six or more drinks on one occasion? 0  AUDIT-C Score 3  4. How often during the last year have you found that you were not able to stop drinking once you had started? 0  5. How often during the last year have you failed to do what was normally expected from you because of drinking? 0  6. How often during the last year have you needed a first drink in the morning to get yourself going after a heavy drinking session? 0  7. How often during the last year have you had a feeling of guilt of remorse after drinking? 0  8. How often during the last year have you been unable to remember what happened the night before because you had been drinking? 0  9. Have you or someone else been injured as a result of your drinking? 0  10. Has a relative or friend or a doctor or another health worker been concerned about your drinking or suggested you cut down? 0  Alcohol Use Disorder Identification Test Final Score (AUDIT) 3   A score of 3 or more in women, and 4 or more in men indicates increased risk for alcohol abuse, EXCEPT if all of the points are from question 1   No results found for any visits on 05/20/23.  Assessment & Plan    Routine Health Maintenance and Physical Exam  Immunization History  Administered Date(s)  Administered   Influenza, High Dose Seasonal PF 02/07/2016, 02/16/2019, 04/05/2021   Influenza,inj,Quad PF,6+ Mos 02/24/2013, 02/17/2014   Influenza-Unspecified 03/09/2018   Pneumococcal Conjugate-13 02/07/2016   Pneumococcal Polysaccharide-23 04/08/2018   Unspecified SARS-COV-2 Vaccination 05/30/2020, 06/26/2020    Health Maintenance  Topic Date Due   COVID-19 Vaccine (3 - 2024-25 season) 06/05/2023 (Originally 12/29/2022)   INFLUENZA VACCINE  07/28/2023 (Originally 11/28/2022)   Zoster Vaccines- Shingrix (1 of 2) 08/18/2023 (Originally 11/03/2000)   MAMMOGRAM  12/25/2023   DEXA SCAN  05/13/2024   Medicare Annual Wellness (AWV)  05/19/2024   Colonoscopy  01/05/2031   Pneumonia Vaccine 51+ Years old  Completed   Hepatitis C Screening  Completed   HPV VACCINES  Aged Out   DTaP/Tdap/Td  Discontinued    Problem List Items Addressed This Visit       Cardiovascular and Mediastinum   Essential (primary) hypertension   Relevant Orders   Comprehensive metabolic panel   TSH + free T4     Nervous and Auditory   Deafness, sensorineural     Musculoskeletal and Integument   Osteopenia   Relevant Orders   CBC     Other   Hypercholesteremia   Relevant Orders   Lipid panel   Anxiety, generalized   Annual physical exam - Primary   [age]-year-old presenting for an annual physical examination. Declined Shingrix and updated COVID vaccines despite recommendation. Recommend 150 mins of exercise per week and well balanced diet  - Order hemoglobin A1c - Order complete metabolic panel - Order CBC - Order lipid panel      Alcohol use disorder, mild, in early remission   Other Visit Diagnoses       Screening for diabetes mellitus       Relevant Orders   Hemoglobin A1c     Screening for deficiency anemia       Relevant Orders   CBC            Essential Hypertension Essential hypertension. Advised to follow a low sodium diet and the DASH diet to manage blood pressure and  reduce cardiovascular risk. - Continue low sodium diet - Continue DASH diet  Hypercholesterolemia Hypercholesterolemia. On rosuvastatin and advised to follow a low-fat diet to lower LDL cholesterol and reduce cardiovascular events risk. - Continue rosuvastatin - Continue low-fat diet  Depression and Anxiety Depression and anxiety. No specific treatment changes discussed.  Osteopenia Osteopenia. No specific treatment changes discussed.  Sensorineural Deafness Sensorineural deafness. No specific treatment changes discussed.  General Health Maintenance Recommended 150 minutes of activity weekly. Reduced alcohol consumption to approximately four glasses per week. Discussed benefits of reduced alcohol intake on liver health and cardiovascular risk. - Recommend 150 minutes of activity weekly - Encourage continued reduction in alcohol consumption  Follow-up - Schedule follow-up appointment in six months.           Return in about 6 months (around 11/17/2023) for CHRONIC F/U.       Ronnald Ramp, MD  [facility] [phone] (phone) [phone] (fax)  [facility]

## 2023-05-20 NOTE — Progress Notes (Signed)
Subjective:   Teresa Duarte is a 73 y.o. female who presents for Medicare Annual (Subsequent) preventive examination.  Visit Complete: In person   Cardiac Risk Factors include: advanced age (>46men, >95 women);hypertension;dyslipidemia     Objective:    Today's Vitals   05/20/23 1355  BP: 138/88  Weight: 142 lb (64.4 kg)  Height: 5\' 3"  (1.6 m)   Body mass index is 25.15 kg/m.     05/20/2023    2:07 PM 06/16/2019   11:12 AM 04/08/2018    9:43 AM 02/07/2016    9:40 AM 02/22/2014    2:20 PM  Advanced Directives  Does Patient Have a Medical Advance Directive? No Yes No No No  Type of Special educational needs teacher of [place];Living will     Copy of Healthcare Power of Attorney in Chart?  No - copy requested     Would patient like information on creating a medical advance directive? No - Patient declined  Yes (MAU/Ambulatory/Procedural Areas - Information given) No - patient declined information No - patient declined information    Current Medications (verified) Outpatient Encounter Medications as of 05/20/2023  Medication Sig   desvenlafaxine (PRISTIQ) 50 MG 24 hr tablet Take 1 tablet (50 mg total) by mouth daily.   diltiazem (DILT-XR) 180 MG 24 hr capsule Take 2 capsules (360 mg total) by mouth daily.   gabapentin (NEURONTIN) 100 MG capsule Take 1 capsule (100 mg total) by mouth at bedtime.   Multiple Vitamin (MULTIVITAMIN) tablet Take 1 tablet by mouth daily. GUMMIES - HAS 50MG  OF COLLAGEN   rosuvastatin (CRESTOR) 10 MG tablet Take 1 tablet (10 mg total) by mouth daily.   traZODone (DESYREL) 50 MG tablet TAKE ONE TABLET BY MOUTH EVERY EVENING AT BEDTIME   valACYclovir (VALTREX) 500 MG tablet As needed   [DISCONTINUED] Calcium-Vitamin D-Vitamin K 500-100-40 MG-UNT-MCG CHEW Chew by mouth. Calcium and Vitamin K   [DISCONTINUED] meloxicam (MOBIC) 7.5 MG tablet Mobic 7.5 mg tablet  Take 1 tablet twice a day by oral route.   No facility-administered encounter medications  on file as of 05/20/2023.    Allergies (verified) Other   History: Past Medical History:  Diagnosis Date   Anxiety    Arthritis    Chronic kidney disease    ?spot on her left kidney   Depression    HBP (high blood pressure)    Heart murmur    doesn't give her any problems   High cholesterol    PONV (postoperative nausea and vomiting)    Past Surgical History:  Procedure Laterality Date   ABDOMINAL HYSTERECTOMY     CHOLECYSTECTOMY N/A 03/03/2014   Procedure: LAPAROSCOPIC CHOLECYSTECTOMY WITH INTRAOPERATIVE CHOLANGIOGRAM;  Surgeon: Abigail Miyamoto, MD;  Location: [facility] OR;  Service: General;  Laterality: N/A;   COLONOSCOPY  2013   FOOT SURGERY Bilateral    SHOULDER SURGERY Right    TENDON REPAIR Left    tendon/ligament repair, pt not sure which   TONSILLECTOMY     Family History  Problem Relation Age of Onset   Hypertension Mother    Hypercholesterolemia Mother    Heart attack Father    Depression Sister    Depression Sister    Hypertension Sister    Hypercholesterolemia Sister    Depression Son    Anxiety disorder Son    Breast cancer Neg Hx    Social History   Socioeconomic History   Marital status: Widowed    Spouse name: Not on file   Number  of children: 2   Years of education: Not on file   Highest education level: GED or equivalent  Occupational History   Occupation: retired  Tobacco Use   Smoking status: Never   Smokeless tobacco: Never  Vaping Use   Vaping status: Never Used  Substance and Sexual Activity   Alcohol use: Yes    Alcohol/week: 21.0 standard drinks of alcohol    Types: 21 Glasses of wine per week    Comment: 3 glasses of wine nightly   Drug use: No   Sexual activity: Yes    Partners: Male    Comment: married, husband has a hx of liver failurem alcohol and drug use with Hep C  Other Topics Concern   Not on file  Social History Narrative   Not on file   Social Drivers of Health   Financial Resource Strain: Low Risk  (05/20/2023)    Overall Financial Resource Strain (CARDIA)    Difficulty of Paying Living Expenses: Not hard at all  Food Insecurity: No Food Insecurity (05/20/2023)   Hunger Vital Sign    Worried About Running Out of Food in the Last Year: Never true    Ran Out of Food in the Last Year: Never true  Transportation Needs: No Transportation Needs (05/20/2023)   PRAPARE - Administrator, Civil Service (Medical): No    Lack of Transportation (Non-Medical): No  Physical Activity: Insufficiently Active (05/20/2023)   Exercise Vital Sign    Days of Exercise per Week: 4 days    Minutes of Exercise per Session: 20 min  Stress: Stress Concern Present (05/20/2023)   [employer] of Occupational Health - Occupational Stress Questionnaire    Feeling of Stress : To some extent  Social Connections: Socially Isolated (05/20/2023)   Social Connection and Isolation Panel [NHANES]    Frequency of Communication with Friends and Family: More than three times a week    Frequency of Social Gatherings with Friends and Family: Twice a week    Attends Religious Services: Never    Database administrator or Organizations: No    Attends Banker Meetings: Never    Marital Status: Widowed    Tobacco Counseling Counseling given: Not Answered   Clinical Intake:  Pre-visit preparation completed: Yes  Pain : No/denies pain     BMI - recorded: 25.15 Nutritional Status: BMI 25 -29 Overweight Nutritional Risks: None Diabetes: No  How often do you need to have someone help you when you read instructions, pamphlets, or other written materials from your doctor or pharmacy?: 1 - Never  Interpreter Needed?: No  Information entered by :: Kennedy Bucker, LPN   Activities of Daily Living    05/20/2023    2:09 PM 05/18/2023    1:52 PM  In your present state of health, do you have any difficulty performing the following activities:  Hearing? 1 1  Vision? 0 0  Difficulty concentrating or making  decisions? 1 1  Walking or climbing stairs? 0 0  Dressing or bathing? 0 0  Doing errands, shopping? 0 0  Preparing Food and eating ? N N  Using the Toilet? N N  In the past six months, have you accidently leaked urine? Y Y  Do you have problems with loss of bowel control? N N  Managing your Medications? N   Managing your Finances? N N  Housekeeping or managing your Housekeeping? N N    Patient Care Team: Ronnald Ramp, MD as PCP -  General (Family Medicine) Christeen Douglas, MD as Consulting Physician (Obstetrics and Gynecology) Sallee Lange, MD (Ophthalmology) Sharrell Ku, MD as Consulting Physician (Gastroenterology) Dominica Severin, MD as Consulting Physician (Orthopedic Surgery) Donzetta Starch, MD as Consulting Physician (Dermatology)  Indicate any recent Medical Services you may have received from other than [facility] providers in the past year (date may be approximate).     Assessment:   This is a routine wellness examination for [place].  Hearing/Vision screen Hearing Screening - Comments:: WEARS AIDS, BOTH EARS  Vision Screening - Comments:: WEARS GLASSES ALL THE TIME- DR.DINGELDEIN   Goals Addressed             This Visit's Progress    DIET - EAT MORE FRUITS AND VEGETABLES         Depression Screen    05/20/2023    2:05 PM 07/02/2022    1:07 PM 06/03/2022    1:24 PM 04/30/2022    2:01 PM 04/25/2021    1:33 PM 03/29/2020    1:30 PM 06/16/2019   11:06 AM  PHQ 2/9 Scores  PHQ - 2 Score 0 2 1 0 2 0 3  PHQ- 9 Score 0 9 6 3 4 3 6     Fall Risk    05/20/2023    2:08 PM 05/18/2023    1:52 PM 06/03/2022    1:23 PM 04/30/2022    2:00 PM 04/25/2021    1:33 PM  Fall Risk   Falls in the past year? 0 0 1 1 0  Number falls in past yr: 0 0 1 1 0  Injury with Fall? 0 1 1 0 0  Risk for fall due to : No Fall Risks  Other (Comment) No Fall Risks No Fall Risks  Follow up Falls prevention discussed;Falls evaluation completed   Falls evaluation completed Falls  evaluation completed    MEDICARE RISK AT HOME: Medicare Risk at Home Any stairs in or around the home?: Yes If so, are there any without handrails?: Yes Home free of loose throw rugs in walkways, pet beds, electrical cords, etc?: Yes Adequate lighting in your home to reduce risk of falls?: Yes Life alert?: No Use of a cane, walker or w/c?: No Grab bars in the bathroom?: No Shower chair or bench in shower?: Yes Elevated toilet seat or a handicapped toilet?: No  TIMED UP AND GO:  Was the test performed?  Yes  Length of time to ambulate 10 feet: 4 sec Gait steady and fast without use of assistive device    Cognitive Function:        05/20/2023    2:11 PM  6CIT Screen  What Year? 0 points  What month? 0 points  What time? 0 points  Count back from 20 0 points  Months in reverse 0 points  Repeat phrase 0 points  Total Score 0 points    Immunizations Immunization History  Administered Date(s) Administered   Influenza, High Dose Seasonal PF 02/07/2016, 02/16/2019, 04/05/2021   Influenza,inj,Quad PF,6+ Mos 02/24/2013, 02/17/2014   Influenza-Unspecified 03/09/2018   Pneumococcal Conjugate-13 02/07/2016   Pneumococcal Polysaccharide-23 04/08/2018   Unspecified SARS-COV-2 Vaccination 05/30/2020, 06/26/2020    TDAP status: Due, Education has been provided regarding the importance of this vaccine. Advised may receive this vaccine at local pharmacy or Health Dept. Aware to provide a copy of the vaccination record if obtained from local pharmacy or Health Dept. Verbalized acceptance and understanding.  Flu Vaccine status: Declined, Education has been provided regarding the importance of  this vaccine but patient still declined. Advised may receive this vaccine at local pharmacy or Health Dept. Aware to provide a copy of the vaccination record if obtained from local pharmacy or Health Dept. Verbalized acceptance and understanding.  Pneumococcal vaccine status: Up to date  Covid-19  vaccine status: Completed vaccines  Qualifies for Shingles Vaccine? Yes   Zostavax completed No   Shingrix Completed?: No.    Education has been provided regarding the importance of this vaccine. Patient has been advised to call insurance company to determine out of pocket expense if they have not yet received this vaccine. Advised may also receive vaccine at local pharmacy or Health Dept. Verbalized acceptance and understanding.  Screening Tests Health Maintenance  Topic Date Due   Zoster Vaccines- Shingrix (1 of 2) Never done   COVID-19 Vaccine (3 - 2024-25 season) 12/29/2022   INFLUENZA VACCINE  07/28/2023 (Originally 11/28/2022)   MAMMOGRAM  12/25/2023   DEXA SCAN  05/13/2024   Medicare Annual Wellness (AWV)  05/19/2024   Colonoscopy  01/05/2031   Pneumonia Vaccine 86+ Years old  Completed   Hepatitis C Screening  Completed   HPV VACCINES  Aged Out   DTaP/Tdap/Td  Discontinued    Health Maintenance  Health Maintenance Due  Topic Date Due   Zoster Vaccines- Shingrix (1 of 2) Never done   COVID-19 Vaccine (3 - 2024-25 season) 12/29/2022    Colorectal cancer screening: Type of screening: Colonoscopy. Completed 01/04/21. Repeat every 10 years  Mammogram status: Completed 12/25/22. Repeat every year  Bone Density status: Completed 05/13/22. Results reflect: Bone density results: OSTEOPENIA. Repeat every 5 years.  Lung Cancer Screening: (Low Dose CT Chest recommended if Age 44-80 years, 20 pack-year currently smoking OR have quit w/in 15years.) does not qualify.    Additional Screening:  Hepatitis C Screening: does qualify; Completed 08/10/12  Vision Screening: Recommended annual ophthalmology exams for early detection of glaucoma and other disorders of the eye. Is the patient up to date with their annual eye exam?  Yes  Who is the provider or what is the name of the office in which the patient attends annual eye exams? DR.DINGELDEIN If pt is not established with a provider,  would they like to be referred to a provider to establish care? No .   Dental Screening: Recommended annual dental exams for proper oral hygiene  Community Resource Referral / Chronic Care Management: CRR required this visit?  No   CCM required this visit?  No     Plan:     I have personally reviewed and noted the following in the patient's chart:   Medical and social history Use of alcohol, tobacco or illicit drugs  Current medications and supplements including opioid prescriptions. Patient is not currently taking opioid prescriptions. Functional ability and status Nutritional status Physical activity Advanced directives List of other physicians Hospitalizations, surgeries, and ER visits in previous 12 months Vitals Screenings to include cognitive, depression, and falls Referrals and appointments  In addition, I have reviewed and discussed with patient certain preventive protocols, quality metrics, and best practice recommendations. A written personalized care plan for preventive services as well as general preventive health recommendations were provided to patient.     Hal Hope, LPN   1/61/0960   After Visit Summary: (In Person-Declined) Patient declined AVS at this time.  Nurse Notes: NONE

## 2023-05-21 ENCOUNTER — Encounter: Payer: Self-pay | Admitting: Family Medicine

## 2023-05-21 LAB — CBC
Hematocrit: 39.2 % (ref 34.0–46.6)
Hemoglobin: 12.9 g/dL (ref 11.1–15.9)
MCH: 30.1 pg (ref 26.6–33.0)
MCHC: 32.9 g/dL (ref 31.5–35.7)
MCV: 91 fL (ref 79–97)
Platelets: 223 10*3/uL (ref 150–450)
RBC: 4.29 x10E6/uL (ref 3.77–5.28)
RDW: 11.7 % (ref 11.7–15.4)
WBC: 5.1 10*3/uL (ref 3.4–10.8)

## 2023-05-21 LAB — COMPREHENSIVE METABOLIC PANEL
ALT: 17 [IU]/L (ref 0–32)
AST: 24 [IU]/L (ref 0–40)
Albumin: 4.7 g/dL (ref 3.8–4.8)
Alkaline Phosphatase: 75 [IU]/L (ref 44–121)
BUN/Creatinine Ratio: 12 (ref 12–28)
BUN: 9 mg/dL (ref 8–27)
Bilirubin Total: 0.4 mg/dL (ref 0.0–1.2)
CO2: 23 mmol/L (ref 20–29)
Calcium: 10 mg/dL (ref 8.7–10.3)
Chloride: 103 mmol/L (ref 96–106)
Creatinine, Ser: 0.73 mg/dL (ref 0.57–1.00)
Globulin, Total: 2 g/dL (ref 1.5–4.5)
Glucose: 97 mg/dL (ref 70–99)
Potassium: 4.6 mmol/L (ref 3.5–5.2)
Sodium: 141 mmol/L (ref 134–144)
Total Protein: 6.7 g/dL (ref 6.0–8.5)
eGFR: 87 mL/min/{1.73_m2} (ref >59)

## 2023-05-21 LAB — HEMOGLOBIN A1C
Est. average glucose Bld gHb Est-mCnc: 117 mg/dL
Hgb A1c MFr Bld: 5.7 % — ABNORMAL HIGH (ref 4.8–5.6)

## 2023-05-21 LAB — TSH+FREE T4
Free T4: 0.98 ng/dL (ref 0.82–1.77)
TSH: 1.15 u[IU]/mL (ref 0.450–4.500)

## 2023-05-21 LAB — LIPID PANEL
Chol/HDL Ratio: 2.8 {ratio} (ref 0.0–4.4)
Cholesterol, Total: 157 mg/dL (ref 100–199)
HDL: 56 mg/dL (ref >39)
LDL Chol Calc (NIH): 63 mg/dL (ref 0–99)
Triglycerides: 238 mg/dL — ABNORMAL HIGH (ref 0–149)
VLDL Cholesterol Cal: 38 mg/dL (ref 5–40)

## 2023-08-04 ENCOUNTER — Other Ambulatory Visit: Payer: Self-pay | Admitting: Family Medicine

## 2023-08-05 NOTE — Telephone Encounter (Signed)
 Requested by interface surescripts. Last OV 05/20/23. Future visit in 3 months .  Requested Prescriptions  Pending Prescriptions Disp Refills   traZODone (DESYREL) 50 MG tablet [Pharmacy Med Name: TRAZODONE HCL 50 MG TAB] 30 tablet 2    Sig: TAKE ONE TABLET BY MOUTH EVERY EVENING AT BEDTIME     Psychiatry: Antidepressants - Serotonin Modulator Failed - 08/05/2023  1:48 PM      Failed - Valid encounter within last 6 months    Recent Outpatient Visits   None     Future Appointments             In 3 months Simmons-Robinson, Makiera, MD [facility]            Passed - Completed PHQ-2 or PHQ-9 in the last 360 days

## 2023-08-11 DIAGNOSIS — H2513 Age-related nuclear cataract, bilateral: Secondary | ICD-10-CM | POA: Diagnosis not present

## 2023-08-20 DIAGNOSIS — M654 Radial styloid tenosynovitis [de Quervain]: Secondary | ICD-10-CM | POA: Diagnosis not present

## 2023-08-20 DIAGNOSIS — D2111 Benign neoplasm of connective and other soft tissue of right upper limb, including shoulder: Secondary | ICD-10-CM | POA: Diagnosis not present

## 2023-09-05 ENCOUNTER — Other Ambulatory Visit: Payer: Self-pay | Admitting: Family Medicine

## 2023-09-05 DIAGNOSIS — G2581 Restless legs syndrome: Secondary | ICD-10-CM

## 2023-10-07 DIAGNOSIS — R002 Palpitations: Secondary | ICD-10-CM | POA: Diagnosis not present

## 2023-10-07 DIAGNOSIS — E7800 Pure hypercholesterolemia, unspecified: Secondary | ICD-10-CM | POA: Diagnosis not present

## 2023-10-07 DIAGNOSIS — I10 Essential (primary) hypertension: Secondary | ICD-10-CM | POA: Diagnosis not present

## 2023-11-18 ENCOUNTER — Encounter: Payer: Self-pay | Admitting: Family Medicine

## 2023-11-18 ENCOUNTER — Ambulatory Visit (INDEPENDENT_AMBULATORY_CARE_PROVIDER_SITE_OTHER): Payer: Self-pay | Admitting: Family Medicine

## 2023-11-18 VITALS — BP 138/80 | HR 74 | Ht 63.0 in | Wt 134.0 lb

## 2023-11-18 DIAGNOSIS — F1011 Alcohol abuse, in remission: Secondary | ICD-10-CM | POA: Diagnosis not present

## 2023-11-18 DIAGNOSIS — M8580 Other specified disorders of bone density and structure, unspecified site: Secondary | ICD-10-CM

## 2023-11-18 DIAGNOSIS — I10 Essential (primary) hypertension: Secondary | ICD-10-CM

## 2023-11-18 DIAGNOSIS — E7800 Pure hypercholesterolemia, unspecified: Secondary | ICD-10-CM

## 2023-11-18 DIAGNOSIS — F411 Generalized anxiety disorder: Secondary | ICD-10-CM | POA: Diagnosis not present

## 2023-11-18 DIAGNOSIS — G629 Polyneuropathy, unspecified: Secondary | ICD-10-CM

## 2023-11-18 DIAGNOSIS — F5104 Psychophysiologic insomnia: Secondary | ICD-10-CM

## 2023-11-18 DIAGNOSIS — G2581 Restless legs syndrome: Secondary | ICD-10-CM

## 2023-11-18 DIAGNOSIS — F3341 Major depressive disorder, recurrent, in partial remission: Secondary | ICD-10-CM | POA: Diagnosis not present

## 2023-11-18 MED ORDER — TRAZODONE HCL 50 MG PO TABS: 50.0000 mg | ORAL_TABLET | Freq: Every day | ORAL | 2 refills | Status: DC

## 2023-11-18 MED ORDER — DILTIAZEM HCL ER 180 MG PO CP24: 360.0000 mg | ORAL_CAPSULE | Freq: Every day | ORAL | 3 refills | Status: AC

## 2023-11-18 MED ORDER — GABAPENTIN 100 MG PO CAPS: 100.0000 mg | ORAL_CAPSULE | Freq: Every day | ORAL | 2 refills | Status: DC

## 2023-11-18 MED ORDER — DESVENLAFAXINE SUCCINATE ER 50 MG PO TB24: 50.0000 mg | ORAL_TABLET | Freq: Every day | ORAL | 2 refills | Status: DC

## 2023-11-18 NOTE — Progress Notes (Signed)
 "     Established patient visit   Patient: Teresa Duarte   DOB: 1950/07/27   73 y.o. Female  MRN: [id number] Visit Date: 11/18/2023  Today's healthcare provider: Rockie Agent, MD   Chief Complaint  Patient presents with   Hypertension    No concerns   Subjective     HPI     Hypertension    Additional comments: No concerns      Last edited by Thelbert Eulalio HERO, CMA on 11/18/2023  2:27 PM.       Discussed the use of AI scribe software for clinical note transcription with the patient, who gave verbal consent to proceed.  History of Present Illness Teresa Duarte is a 73 year old female who presents for a follow-up visit.  Her blood pressure is managed with diltiazem  360 mg daily, maintaining a reading of 138/80 mmHg.  She has chronic depression and takes Pristiq  50 mg daily, which alleviates her anxiety and depression.  For neuropathy and restless legs, she takes gabapentin  100 mg at bedtime. She experiences relief from symptoms, although during a full moon, her legs become restless despite medication.  She has a history of alcohol abuse disorder and has reduced her alcohol intake to one glass of wine per night. She has reduced her alcohol intake to one glass of wine per night.  She takes trazodone  50 mg at bedtime for sleep, which is effective.  She is on Crestor  for hypercholesterolemia and has a sufficient supply for a year.  She occasionally uses Valtrex  for herpes outbreaks, which have decreased in frequency since her husband's stress-inducing behavior has ceased.  She decided to take aspirin 81 mg after hearing about its use from another doctor.     Past Medical History:  Diagnosis Date   Anxiety    Arthritis    Chronic kidney disease    ?spot on her left kidney   Depression    HBP (high blood pressure)    Heart murmur    doesn't give her any problems   High cholesterol    PONV (postoperative nausea and vomiting)      Medications: Outpatient Medications Prior to Visit  Medication Sig   aspirin EC (ASPIRIN ADULT LOW DOSE) 81 MG tablet    Multiple Vitamin (MULTIVITAMIN) tablet Take 1 tablet by mouth daily. GUMMIES - HAS 50MG  OF COLLAGEN   rosuvastatin  (CRESTOR ) 10 MG tablet Take 1 tablet (10 mg total) by mouth daily.   valACYclovir  (VALTREX ) 500 MG tablet As needed   [DISCONTINUED] desvenlafaxine  (PRISTIQ ) 50 MG 24 hr tablet Take 1 tablet (50 mg total) by mouth daily.   [DISCONTINUED] diltiazem  (DILT-XR) 180 MG 24 hr capsule Take 2 capsules (360 mg total) by mouth daily.   [DISCONTINUED] gabapentin  (NEURONTIN ) 100 MG capsule TAKE ONE CAPSULE BY MOUTH AT BEDTIME   [DISCONTINUED] traZODone  (DESYREL ) 50 MG tablet TAKE ONE TABLET BY MOUTH EVERY EVENING AT BEDTIME   No facility-administered medications prior to visit.    Review of Systems  Last CBC Lab Results  Component Value Date   WBC 5.1 05/20/2023   HGB 12.9 05/20/2023   HCT 39.2 05/20/2023   MCV 91 05/20/2023   MCH 30.1 05/20/2023   RDW 11.7 05/20/2023   PLT 223 05/20/2023   Last metabolic panel Lab Results  Component Value Date   GLUCOSE 97 05/20/2023   NA 141 05/20/2023   K 4.6 05/20/2023   CL 103 05/20/2023   CO2 23 05/20/2023   BUN 9 05/20/2023  CREATININE 0.73 05/20/2023   EGFR 87 05/20/2023   CALCIUM  10.0 05/20/2023   PROT 6.7 05/20/2023   ALBUMIN 4.7 05/20/2023   LABGLOB 2.0 05/20/2023   AGRATIO 2.4 (H) 04/30/2022   BILITOT 0.4 05/20/2023   ALKPHOS 75 05/20/2023   AST 24 05/20/2023   ALT 17 05/20/2023   ANIONGAP 14 02/22/2014   Last lipids Lab Results  Component Value Date   CHOL 157 05/20/2023   HDL 56 05/20/2023   LDLCALC 63 05/20/2023   LDLDIRECT 99 04/30/2022   TRIG 238 (H) 05/20/2023   CHOLHDL 2.8 05/20/2023   Last hemoglobin A1c Lab Results  Component Value Date   HGBA1C 5.7 (H) 05/20/2023   Last thyroid  functions Lab Results  Component Value Date   TSH 1.150 05/20/2023   Last vitamin D No  results found for: 25OHVITD2, 25OHVITD3, VD25OH Last vitamin B12 and Folate No results found for: VITAMINB12, FOLATE      Objective    BP 138/80   Pulse 74   Ht 5' 3 (1.6 m)   Wt 134 lb (60.8 kg)   SpO2 99%   BMI 23.74 kg/m  BP Readings from Last 3 Encounters:  11/18/23 138/80  05/20/23 133/74  05/20/23 138/88   Wt Readings from Last 3 Encounters:  11/18/23 134 lb (60.8 kg)  05/20/23 146 lb (66.2 kg)  05/20/23 142 lb (64.4 kg)        Physical Exam Vitals reviewed.  Constitutional:      General: She is not in acute distress.    Appearance: Normal appearance. She is not ill-appearing.  Cardiovascular:     Rate and Rhythm: Normal rate and regular rhythm.  Pulmonary:     Effort: Pulmonary effort is normal. No respiratory distress.     Breath sounds: No wheezing, rhonchi or rales.  Neurological:     Mental Status: She is alert and oriented to person, place, and time.  Psychiatric:        Mood and Affect: Mood normal.        Behavior: Behavior normal.       No results found for any visits on 11/18/23.   Assessment & Plan     Problem List Items Addressed This Visit       Cardiovascular and Mediastinum   Essential (primary) hypertension - Primary   Relevant Medications   aspirin EC (ASPIRIN ADULT LOW DOSE) 81 MG tablet   diltiazem  (DILT-XR) 180 MG 24 hr capsule     Musculoskeletal and Integument   Osteopenia     Other   Hypercholesteremia   Relevant Medications   aspirin EC (ASPIRIN ADULT LOW DOSE) 81 MG tablet   diltiazem  (DILT-XR) 180 MG 24 hr capsule   Clinical depression   Relevant Medications   desvenlafaxine  (PRISTIQ ) 50 MG 24 hr tablet   traZODone  (DESYREL ) 50 MG tablet   Anxiety, generalized   Relevant Medications   desvenlafaxine  (PRISTIQ ) 50 MG 24 hr tablet   traZODone  (DESYREL ) 50 MG tablet   Alcohol use disorder, mild, in early remission   Other Visit Diagnoses       Neuropathy       Relevant Medications   gabapentin   (NEURONTIN ) 100 MG capsule     Chronic insomnia       Relevant Medications   traZODone  (DESYREL ) 50 MG tablet     Restless leg       Relevant Medications   gabapentin  (NEURONTIN ) 100 MG capsule        Assessment &  Plan Hypertension Chronic Blood pressure is well-controlled at 138/80 mmHg with current diltiazem  therapy. - Continue diltiazem  360 mg daily.  Alcohol Use Disorder Chronic Significant reduction in alcohol consumption to one glass of wine per night. Acknowledges impact on neuropathy and restless legs symptoms. - Encourage reduction to one glass of wine on Friday and Saturday only.  Chronic Depression Managed with Pristiq , effectively alleviating anxiety and depression symptoms. - Continue Pristiq  50 mg daily.  Restless Legs Syndrome Chronic  Symptoms alleviated by gabapentin ; exacerbated by alcohol consumption. - Continue gabapentin  100 mg daily at bedtime.  Insomnia Chronic Trazodone  effectively aids sleep. - Continue trazodone  50 mg at bedtime.  Hypercholesterolemia Managed with Crestor , prescription current for one year. - Continue Crestor  as prescribed.  Herpes Simplex Virus (HSV) Outbreaks less frequent, associated with stress, managed with Valtrex  as needed. - Continue Valtrex  as needed for outbreaks.  General Health Maintenance Aspirin for primary prevention. Labs from January were satisfactory; no new labs needed. - Continue aspirin 81 mg daily for primary prevention. - Schedule wellness exam in January 2026.  Follow-up Wellness exam scheduled for January 2026, likely via phone with nurse health advisor. - Schedule follow-up appointment in six months for routine check-up. - Coordinate Medicare wellness exam with routine check-up in January 2026.     Return in about 27 weeks (around 05/25/2024) for HTN, Depression, Anxiety.         Rockie Agent, MD  [facility] [phone] (phone) [phone]  (fax)  [facility] "

## 2023-11-20 ENCOUNTER — Telehealth: Payer: Self-pay | Admitting: Family Medicine

## 2023-11-20 MED ORDER — VALACYCLOVIR HCL 500 MG PO TABS: ORAL_TABLET | ORAL | 3 refills | Status: AC

## 2023-11-20 NOTE — Telephone Encounter (Signed)
 [facility] pharmacy faxed refill request for the following medications:   valACYclovir  (VALTREX ) 500 MG tablet    Please advise

## 2023-11-20 NOTE — Telephone Encounter (Signed)
 Rx has been sent to the pharmacy

## 2023-12-22 ENCOUNTER — Other Ambulatory Visit: Payer: Self-pay | Admitting: Family Medicine

## 2023-12-22 DIAGNOSIS — Z1231 Encounter for screening mammogram for malignant neoplasm of breast: Secondary | ICD-10-CM

## 2024-01-14 ENCOUNTER — Ambulatory Visit
Admission: RE | Admit: 2024-01-14 | Discharge: 2024-01-14 | Disposition: A | Discharge status: Home / Self Care | Source: Ambulatory Visit | Attending: Family Medicine | Admitting: Family Medicine

## 2024-01-14 DIAGNOSIS — Z1231 Encounter for screening mammogram for malignant neoplasm of breast: Secondary | ICD-10-CM | POA: Insufficient documentation

## 2024-02-06 ENCOUNTER — Other Ambulatory Visit: Payer: Self-pay | Admitting: Family Medicine

## 2024-02-06 DIAGNOSIS — E7800 Pure hypercholesterolemia, unspecified: Secondary | ICD-10-CM

## 2024-02-09 ENCOUNTER — Ambulatory Visit: Payer: Self-pay

## 2024-02-09 NOTE — Telephone Encounter (Signed)
 FYI Only or Action Required?: FYI only for provider.  Patient was last seen in primary care on 11/18/2023 by Sharma Coyer, MD.  Called Nurse Triage reporting Advice Only.  Symptoms began several days ago.  Interventions attempted: Nothing.  Symptoms are: asymptomatic.  Triage Disposition: Information or Advice Only Call  Patient/caregiver understands and will follow disposition?:  Reason for Disposition  Health information question, no triage required and triager able to answer question  Answer Assessment - Initial Assessment Questions Exposed to covid 2 days ago, is asymptomatic. Asking advise for next steps. Advised that she should monitor for symptoms and if they develop, take a home test and then call the office.   Advised to avoid anyone immunocompromised or wear a mask and keep a distance if necessary.  Protocols used: Information Only Call - No Triage-A-AH Copied from CRM [id number]. Topic: Clinical - Medical Advice >> Feb 09, 2024 11:59 AM Wess RAMAN wrote: Reason for CRM: Patient has been exposed to covid but is not having any symptoms that she is aware of. She would like to know what she should do.  Callback #: [phone]

## 2024-04-07 DIAGNOSIS — E7800 Pure hypercholesterolemia, unspecified: Secondary | ICD-10-CM | POA: Diagnosis not present

## 2024-04-07 DIAGNOSIS — R002 Palpitations: Secondary | ICD-10-CM | POA: Diagnosis not present

## 2024-05-12 ENCOUNTER — Other Ambulatory Visit: Payer: Self-pay | Admitting: Family Medicine

## 2024-05-12 DIAGNOSIS — F3341 Major depressive disorder, recurrent, in partial remission: Secondary | ICD-10-CM

## 2024-05-25 ENCOUNTER — Ambulatory Visit: Admitting: Family Medicine

## 2024-05-25 ENCOUNTER — Ambulatory Visit: Payer: Self-pay

## 2024-05-25 ENCOUNTER — Ambulatory Visit

## 2024-06-02 ENCOUNTER — Encounter: Payer: Self-pay | Admitting: Family Medicine

## 2024-06-02 ENCOUNTER — Ambulatory Visit: Admitting: Family Medicine

## 2024-06-02 VITALS — BP 120/76 | HR 72 | Ht 63.0 in | Wt 131.9 lb

## 2024-06-02 DIAGNOSIS — E7800 Pure hypercholesterolemia, unspecified: Secondary | ICD-10-CM

## 2024-06-02 DIAGNOSIS — I10 Essential (primary) hypertension: Secondary | ICD-10-CM

## 2024-06-02 DIAGNOSIS — F411 Generalized anxiety disorder: Secondary | ICD-10-CM

## 2024-06-02 DIAGNOSIS — M24811 Other specific joint derangements of right shoulder, not elsewhere classified: Secondary | ICD-10-CM

## 2024-06-02 DIAGNOSIS — F320 Major depressive disorder, single episode, mild: Secondary | ICD-10-CM

## 2024-06-02 DIAGNOSIS — F5104 Psychophysiologic insomnia: Secondary | ICD-10-CM | POA: Insufficient documentation

## 2024-06-02 DIAGNOSIS — M170 Bilateral primary osteoarthritis of knee: Secondary | ICD-10-CM | POA: Insufficient documentation

## 2024-06-02 DIAGNOSIS — M8580 Other specified disorders of bone density and structure, unspecified site: Secondary | ICD-10-CM

## 2024-06-02 DIAGNOSIS — F1011 Alcohol abuse, in remission: Secondary | ICD-10-CM

## 2024-06-02 MED ORDER — TRAZODONE HCL 50 MG PO TABS: 50.0000 mg | ORAL_TABLET | Freq: Every day | ORAL | 2 refills | Status: AC

## 2024-06-02 NOTE — Assessment & Plan Note (Signed)
 Chronic  Depression is well-managed on desvenlafaxine  50 mg daily. PHQ-9 score increased from 6 to 8. She declined increasing the dose to 100 mg due to previous side effects. Prefers not to engage in therapy but is open to exploring options online. - Continue desvenlafaxine  50 mg daily. - Recommended exploring therapy options online via [email or web address].

## 2024-06-02 NOTE — Progress Notes (Signed)
 "  Established Patient Office Visit  Patient ID: Teresa Duarte, female    DOB: January 11, 1951  Age: 74 y.o. MRN: [id number] PCP: Sharma Coyer, MD  Chief Complaint  Patient presents with   Medical Management of Chronic Issues    Patient reports taking medications as prescribed and reports no symptoms. She reports anxiety/depression is the same as last visit. She reports she is also having some knee pain and her right shoulder has been popping.    Hypertension   Depression   Anxiety   Hyperlipidemia    Subjective:     HPI  Discussed the use of AI scribe software for clinical note transcription with the patient, who gave verbal consent to proceed.  History of Present Illness Teresa Duarte is a 74 year old female who presents for a chronic follow-up visit.  Her anxiety and depression symptoms remain unchanged since the last visit. She continues to take Pristiq  50 mg daily for depression and trazodone  50 mg at bedtime for insomnia, which helps with sleep most of the time. She previously tried gabapentin  but stopped it due to ineffectiveness. Her PHQ-9 score has increased slightly from 6 to 8, and her GAD-7 score remains at 7 since July.  For hypertension, she continues to take diltiazem  180 mg daily. She mentions that her blood pressure medication affects her eating schedule, as she avoids eating an hour after or two hours before taking it.  She continues to take Crestor  10 mg daily for hyperlipidemia and is scheduled to have her cholesterol levels checked today.  She experiences knee pain and a popping sensation in her right shoulder, attributed to arthritis and a past rotator cuff surgery approximately 20 years ago. The shoulder popping does not cause pain, but her knees sometimes feel like they might give out, especially in cold weather. No shortness of breath.  She discusses her alcohol consumption, stating she has not had any alcohol in three or four nights and usually only  drinks out of boredom. She typically has a glass of wine when dining out but not at home unless it is snowing.  She shares that her younger son is in prison and her older son has prostate cancer and ulcerative colitis, which causes her stress. Her older son's cancer has returned aggressively, and he has developed additional cancer spots. She is concerned for her sons' health and well-being.   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    06/02/2024    1:09 PM 11/18/2023    2:32 PM 05/20/2023    2:05 PM  PHQ9 SCORE ONLY  PHQ-9 Total Score 8 6  0      Data saved with a previous flowsheet row definition      06/02/2024    1:09 PM 11/18/2023    2:33 PM  GAD 7 : Generalized Anxiety Score  Nervous, Anxious, on Edge 1 1   Control/stop worrying 1 1   Worry too much - different things 1 1   Trouble relaxing 1 1   Restless 1 1   Easily annoyed or irritable 1 1   Afraid - awful might happen 1 1   Total GAD 7 Score 7 7  Anxiety Difficulty Not difficult at all Not difficult at all     Data saved with a previous flowsheet row definition       Objective:     BP 120/76 (BP Location: Left Arm, Cuff Size: Normal)   Pulse 72   Ht 5' 3 (1.6 m)   Wt  131 lb 14.4 oz (59.8 kg)   SpO2 100%   BMI 23.37 kg/m  BP Readings from Last 3 Encounters:  06/02/24 120/76  11/18/23 138/80  05/20/23 133/74   Wt Readings from Last 3 Encounters:  06/02/24 131 lb 14.4 oz (59.8 kg)  11/18/23 134 lb (60.8 kg)  05/20/23 146 lb (66.2 kg)      Physical Exam Vitals reviewed.  Constitutional:      General: She is not in acute distress.    Appearance: Normal appearance. She is not ill-appearing.  Cardiovascular:     Rate and Rhythm: Normal rate and regular rhythm.  Pulmonary:     Effort: Pulmonary effort is normal. No respiratory distress.     Breath sounds: No wheezing, rhonchi or rales.  Neurological:     Mental Status: She is alert and oriented to person, place, and time.  Psychiatric:        Mood and Affect: Mood normal.        Behavior: Behavior normal.      No results found for any visits on 06/02/24.  Last CBC Lab Results  Component Value Date   WBC 5.1 05/20/2023   HGB 12.9 05/20/2023   HCT 39.2 05/20/2023   MCV 91 05/20/2023   MCH 30.1 05/20/2023   RDW 11.7 05/20/2023   PLT 223 05/20/2023   Last metabolic panel Lab Results  Component Value Date   GLUCOSE 97 05/20/2023   NA 141 05/20/2023   K 4.6 05/20/2023   CL 103 05/20/2023   CO2 23 05/20/2023   BUN 9 05/20/2023   CREATININE 0.73 05/20/2023   EGFR 87 05/20/2023   CALCIUM  10.0 05/20/2023   PROT 6.7 05/20/2023   ALBUMIN 4.7 05/20/2023   LABGLOB 2.0 05/20/2023   AGRATIO 2.4 (H) 04/30/2022   BILITOT 0.4 05/20/2023   ALKPHOS 75 05/20/2023   AST 24 05/20/2023   ALT 17 05/20/2023   ANIONGAP 14 02/22/2014   Last lipids Lab Results  Component Value Date   CHOL 157 05/20/2023   HDL 56 05/20/2023   LDLCALC 63 05/20/2023   LDLDIRECT 99 04/30/2022   TRIG 238 (H) 05/20/2023   CHOLHDL 2.8 05/20/2023   Last hemoglobin A1c Lab Results  Component Value Date   HGBA1C 5.7 (H) 05/20/2023   Last thyroid  functions Lab Results  Component Value Date   TSH 1.150  05/20/2023   FREET4 0.98 05/20/2023   Last vitamin D No results found for: 25OHVITD2, 25OHVITD3, VD25OH Last vitamin B12 and Folate No results found for: VITAMINB12, FOLATE    The 10-year ASCVD risk score (Arnett DK, et al., 2019) is: 14.6%  Outpatient Encounter Medications as of 06/02/2024  Medication Sig   aspirin EC (ASPIRIN ADULT LOW DOSE) 81 MG tablet    desvenlafaxine  (PRISTIQ ) 50  MG 24 hr tablet TAKE ONE TABLET BY MOUTH DAILY   diltiazem  (DILT-XR) 180 MG 24 hr capsule Take 2 capsules (360 mg total) by mouth daily.   Multiple Vitamin (MULTIVITAMIN) tablet Take 1 tablet by mouth daily. GUMMIES - HAS 50MG  OF COLLAGEN   rosuvastatin  (CRESTOR ) 10 MG tablet TAKE ONE TABLET BY MOUTH DAILY   traZODone  (DESYREL ) 50 MG tablet Take 1 tablet (50 mg total) by mouth at bedtime.   valACYclovir  (VALTREX ) 500 MG tablet As needed   [DISCONTINUED] gabapentin  (NEURONTIN ) 100 MG capsule Take 1 capsule (100 mg total) by mouth at bedtime. (Patient not taking: Reported on 06/02/2024)   [DISCONTINUED] traZODone  (DESYREL ) 50 MG tablet Take 1 tablet (50 mg total) by mouth at bedtime.   No facility-administered encounter medications on file as of 06/02/2024.     Assessment & Plan:   Problem List Items Addressed This Visit     Alcohol use disorder, mild, in early remission   Chronic  Alcohol use is in remission. She reports not drinking for the past 3-4 nights due to boredom and limits intake to one glass of wine at night when dining out. - Continue current management.      Anxiety, generalized - Primary   Chronic  Anxiety is well-managed with a GAD-7 score of 7. Discussed potential benefit of buspirone for anxiety management, but she declined. - Continue current management with Pristiq  50mg  daily       Relevant Medications   traZODone  (DESYREL ) 50 MG tablet   Chronic insomnia   Chronic insomnia Insomnia is managed with trazodone  50 mg at bedtime. She reports it helps most of the time.  Gabapentin  was discontinued as it was ineffective. - Continue trazodone  50 mg at bedtime. - Refilled trazodone  prescription.      Relevant Medications   traZODone  (DESYREL ) 50 MG tablet   Depression, major, single episode, mild   Chronic  Depression is well-managed on desvenlafaxine  50 mg daily. PHQ-9 score increased from 6 to 8. She declined increasing the dose to 100 mg due to previous side effects. Prefers not to engage in therapy but is open to exploring options online. - Continue desvenlafaxine  50 mg daily. - Recommended exploring therapy options online via [email or web address].      Relevant Medications   traZODone  (DESYREL ) 50 MG tablet   Essential (primary) hypertension   Chronic  Blood pressure is well-controlled at 120/76 mmHg. - Continue diltiazem  180 mg daily. Follow up with cardiology        Relevant Orders   CMP14+EGFR   Hypercholesteremia   Chronic  Cholesterol management is ongoing with rosuvastatin  10 mg daily. - Continue rosuvastatin  10 mg daily. - Ordered lipid panel.      Relevant Orders   Lipid panel   Osteopenia   Chronic  Follow-up on osteopenia is planned with a bone density scan. - Ordered bone density scan.      Relevant Orders   DG Bone Density   Primary osteoarthritis of both knees   Primary osteoarthritis of both knees Chronic  Knee pain likely due to osteoarthritis, exacerbated by cold weather. She reports feeling like knees might give out. - Referred to orthopedics for evaluation of knee pain.      Other Visit Diagnoses       Crepitus of right shoulder joint           Assessment and Plan Assessment & Plan   Right shoulder joint derangement Right shoulder popping, likely related to previous rotator cuff surgery. -  Referred to orthopedics for evaluation of shoulder popping.    Restless legs syndrome Chronic  Gabapentin  was discontinued as it was ineffective. Discussed trial of ropinirole at bedtime for symptom  management. - Trial ropinirole at bedtime for restless legs syndrome offered, pt would like to hold off for now   General health maintenance Routine health maintenance is ongoing. Wellness visit is scheduled. - Scheduled wellness visit. - Ordered CMP and lipid panel.    Return in about 8 months (around 01/30/2025) for Chronic F/U, HTN.    Rockie Agent, MD [facility]  "

## 2024-06-02 NOTE — Assessment & Plan Note (Signed)
 Primary osteoarthritis of both knees Chronic  Knee pain likely due to osteoarthritis, exacerbated by cold weather. She reports feeling like knees might give out. - Referred to orthopedics for evaluation of knee pain.

## 2024-06-02 NOTE — Assessment & Plan Note (Signed)
 Chronic  Cholesterol management is ongoing with rosuvastatin  10 mg daily. - Continue rosuvastatin  10 mg daily. - Ordered lipid panel.

## 2024-06-02 NOTE — Patient Instructions (Addendum)
 Your Annual Wellness Visit has been scheduled for Wednesday June 16, 2024 at 3:50 pm with Jhonnie Das. If this date or time does not work for you please contact her at [phone].   Directions for appointment arrival location: [address] [place], [place] [address]    [facility] [address] [place],  [place]  [address] Main: [phone]    [email or web address]

## 2024-06-02 NOTE — Assessment & Plan Note (Signed)
 Chronic  Alcohol use is in remission. She reports not drinking for the past 3-4 nights due to boredom and limits intake to one glass of wine at night when dining out. - Continue current management.

## 2024-06-02 NOTE — Assessment & Plan Note (Signed)
 Chronic  Blood pressure is well-controlled at 120/76 mmHg. - Continue diltiazem  180 mg daily. Follow up with cardiology

## 2024-06-02 NOTE — Assessment & Plan Note (Signed)
 Chronic  Anxiety is well-managed with a GAD-7 score of 7. Discussed potential benefit of buspirone for anxiety management, but she declined. - Continue current management with Pristiq  50mg  daily

## 2024-06-02 NOTE — Assessment & Plan Note (Signed)
 Chronic  Follow-up on osteopenia is planned with a bone density scan. - Ordered bone density scan.

## 2024-06-02 NOTE — Assessment & Plan Note (Signed)
 Chronic insomnia Insomnia is managed with trazodone  50 mg at bedtime. She reports it helps most of the time. Gabapentin  was discontinued as it was ineffective. - Continue trazodone  50 mg at bedtime. - Refilled trazodone  prescription.

## 2024-06-03 LAB — CMP14+EGFR
ALT: 14 [IU]/L (ref 0–32)
AST: 22 [IU]/L (ref 0–40)
Albumin: 4.7 g/dL (ref 3.8–4.8)
Alkaline Phosphatase: 76 [IU]/L (ref 49–135)
BUN/Creatinine Ratio: 16 (ref 12–28)
BUN: 12 mg/dL (ref 8–27)
Bilirubin Total: 0.4 mg/dL (ref 0.0–1.2)
CO2: 20 mmol/L (ref 20–29)
Calcium: 9.6 mg/dL (ref 8.7–10.3)
Chloride: 104 mmol/L (ref 96–106)
Creatinine, Ser: 0.75 mg/dL (ref 0.57–1.00)
Globulin, Total: 2.2 g/dL (ref 1.5–4.5)
Glucose: 95 mg/dL (ref 70–99)
Potassium: 4 mmol/L (ref 3.5–5.2)
Sodium: 140 mmol/L (ref 134–144)
Total Protein: 6.9 g/dL (ref 6.0–8.5)
eGFR: 84 mL/min/{1.73_m2}

## 2024-06-03 LAB — LIPID PANEL
Chol/HDL Ratio: 2.8 ratio (ref 0.0–4.4)
Cholesterol, Total: 171 mg/dL (ref 100–199)
HDL: 61 mg/dL
LDL Chol Calc (NIH): 91 mg/dL (ref 0–99)
Triglycerides: 104 mg/dL (ref 0–149)
VLDL Cholesterol Cal: 19 mg/dL (ref 5–40)

## 2024-06-04 ENCOUNTER — Encounter: Payer: Self-pay | Admitting: Family Medicine

## 2024-06-16 ENCOUNTER — Ambulatory Visit
